# Patient Record
Sex: Male | Born: 1970 | Race: White | Hispanic: No | Marital: Single | Smoking: Current every day smoker
Health system: Southern US, Community
[De-identification: ages and names within clinical notes are randomized; demographics above are authoritative.]

## PROBLEM LIST (undated history)

## (undated) DIAGNOSIS — F1910 Other psychoactive substance abuse, uncomplicated: Secondary | ICD-10-CM

## (undated) DIAGNOSIS — Z720 Tobacco use: Secondary | ICD-10-CM

## (undated) DIAGNOSIS — I10 Essential (primary) hypertension: Secondary | ICD-10-CM

## (undated) DIAGNOSIS — F1190 Opioid use, unspecified, uncomplicated: Secondary | ICD-10-CM

## (undated) HISTORY — DX: Tobacco use: Z72.0

## (undated) HISTORY — PX: HERNIA REPAIR: SHX51

## (undated) HISTORY — DX: Opioid use, unspecified, uncomplicated: F11.90

---

## 2000-10-20 ENCOUNTER — Ambulatory Visit (HOSPITAL_BASED_OUTPATIENT_CLINIC_OR_DEPARTMENT_OTHER): Admission: RE | Admit: 2000-10-20 | Discharge: 2000-10-20 | Payer: Self-pay | Admitting: General Surgery

## 2002-09-12 ENCOUNTER — Encounter: Payer: Self-pay | Admitting: Emergency Medicine

## 2002-09-12 ENCOUNTER — Emergency Department (HOSPITAL_COMMUNITY): Admission: EM | Admit: 2002-09-12 | Discharge: 2002-09-12 | Payer: Self-pay | Admitting: Emergency Medicine

## 2004-01-07 ENCOUNTER — Emergency Department (HOSPITAL_COMMUNITY): Admission: EM | Admit: 2004-01-07 | Discharge: 2004-01-07 | Payer: Self-pay | Admitting: Emergency Medicine

## 2004-03-09 ENCOUNTER — Emergency Department (HOSPITAL_COMMUNITY): Admission: EM | Admit: 2004-03-09 | Discharge: 2004-03-09 | Payer: Self-pay

## 2004-04-01 ENCOUNTER — Ambulatory Visit: Payer: Self-pay | Admitting: Family Medicine

## 2006-04-30 ENCOUNTER — Emergency Department (HOSPITAL_COMMUNITY): Admission: EM | Admit: 2006-04-30 | Discharge: 2006-04-30 | Payer: Self-pay | Admitting: Emergency Medicine

## 2006-05-03 ENCOUNTER — Emergency Department (HOSPITAL_COMMUNITY): Admission: EM | Admit: 2006-05-03 | Discharge: 2006-05-03 | Payer: Self-pay | Admitting: Emergency Medicine

## 2007-03-16 ENCOUNTER — Emergency Department (HOSPITAL_COMMUNITY): Admission: EM | Admit: 2007-03-16 | Discharge: 2007-03-16 | Payer: Self-pay | Admitting: Emergency Medicine

## 2010-09-18 NOTE — Op Note (Signed)
John Cabrera. [facility]  Patient:    DELFIN, Cabrera                      MRN: [id number] Proc. Date: 10/20/00 Adm. Date:  [id number] Attending:  Brandy Hale CC:         Reuben Likes, M.D.   Operative Report  PREOPERATIVE DIAGNOSIS:  Left inguinal hernia.  POSTOPERATIVE DIAGNOSIS:  Left inguinal hernia.  OPERATION PERFORMED:  Repair of left inguinal hernia with mesh.  SURGEON:  Angelia Mould. Derrell Lolling, M.D.  ANESTHESIA:  INDICATIONS FOR PROCEDURE:  The patient is a 40 year old white male who has had a painful bulge in his left groin for about two weeks.  This began to bother him after he was tilling his garden and he has had a painful bulge since that time.  He denies abdominal pain, nausea or vomiting.  On exam he has a large left inguinal hernia which is an indirect hernia extending all the way down through the external ring.  This can be reduced but it requires some difficulty.  No evidence of hernia on the right. He is brought to the operating room electively.  OPERATIVE FINDINGS:   The patient had a large indirect left inguinal hernia containing a lot of omentum which was partially incarcerated and had to be physically reduced.  The floor of the inguinal canal was weak but was not bulging.  DESCRIPTION OF PROCEDURE:  Following the induction of monitored sedation anesthesia, the patients left groin was prepped and draped in a sterile fashion.  Throughout the case, the patient was monitored and sedated by the anesthesia department.  1% Xylocaine with epinephrine was used a local infiltration anesthetic and a left ilioinguinal nerve block.  A transverse incision was made overlying the left inguinal canal.  Dissection was carried down through the subcutaneous tissues exposing the aponeurosis of the external oblique.  The external oblique was incised in the direction of its fibers, opening up the external inguinal ring.  The external oblique  was retracted. The cord structures were mobilized and encircled with a Penrose drain. The ilioinguinal nerve was identified and preserved.  Cremasteric muscle fibers were dissected away from the cord structures.  The vas deferens and testicular vessels were preserved.  A very large indirect hernia sac was dissected away from the surrounding structures all the way back to the internal ring.  The indirect sac was opened and a large amount of omentum was present which was then gently reduced.  Once this was reduced, the hernia sac was seen to be a simple sac.  Palpation within the abdomen was unremarkable.  The indirect sac was twisted under direct vision and then suture ligated at the level of the internal ring with a suture ligature of 2-0 silk.  The redundant sac was excised and discarded.  The floor of the inguinal canal was repaired and reinforced with an onlay graft of polypropylene mesh.  The mesh was cut into an appropriate shape and sutured in place with running sutures of 2-0 Prolene. The mesh was sutured so as to generously overlap the fascia at the pubic tubercle, along the inguinal ligament inferiorly, then along the internal oblique and conjoined tendon superiorly.  The mesh was incised laterally so as to wrap around the cord structures at the internal ring.  The tails of the mesh were overlapped laterally and the suture lines completed.   This provided a very secure repair both medial and lateral  to the internal ring but allowed an adequate opening for the cord structures.  The wound was irrigated with saline.  Hemostasis was excellent.  The cord structures and the ilioinguinal nerve were placed superficial to the mesh.  The external oblique was closed with a running suture of 2-0 Vicryl.  Scarpas fascia was closed with 3-0 Vicryl sutures and the skin closed with running subcuticular suture of 4-0 Vicryl and Steri-Strips.  Clean bandages were placed.  The patient was transferred  to the recovery room in stable condition.  Estimated blood loss was about 10 cc. Complications were none.  Sponge, needle and instrument counts were correct. DD:  10/20/00 TD:  10/20/00 Job: [id number]

## 2013-01-03 ENCOUNTER — Emergency Department (HOSPITAL_COMMUNITY)
Admission: EM | Admit: 2013-01-03 | Discharge: 2013-01-03 | Disposition: A | Discharge status: Home / Self Care | Payer: Self-pay | Attending: Emergency Medicine | Admitting: Emergency Medicine

## 2013-01-03 ENCOUNTER — Emergency Department (HOSPITAL_COMMUNITY): Payer: Self-pay

## 2013-01-03 ENCOUNTER — Encounter (HOSPITAL_COMMUNITY): Payer: Self-pay

## 2013-01-03 DIAGNOSIS — S300XXA Contusion of lower back and pelvis, initial encounter: Secondary | ICD-10-CM

## 2013-01-03 DIAGNOSIS — S20219A Contusion of unspecified front wall of thorax, initial encounter: Secondary | ICD-10-CM | POA: Insufficient documentation

## 2013-01-03 DIAGNOSIS — W1789XA Other fall from one level to another, initial encounter: Secondary | ICD-10-CM | POA: Insufficient documentation

## 2013-01-03 DIAGNOSIS — Y9389 Activity, other specified: Secondary | ICD-10-CM | POA: Insufficient documentation

## 2013-01-03 DIAGNOSIS — S20229A Contusion of unspecified back wall of thorax, initial encounter: Secondary | ICD-10-CM | POA: Insufficient documentation

## 2013-01-03 DIAGNOSIS — Z79899 Other long term (current) drug therapy: Secondary | ICD-10-CM | POA: Insufficient documentation

## 2013-01-03 DIAGNOSIS — S20211A Contusion of right front wall of thorax, initial encounter: Secondary | ICD-10-CM

## 2013-01-03 DIAGNOSIS — W1809XA Striking against other object with subsequent fall, initial encounter: Secondary | ICD-10-CM | POA: Insufficient documentation

## 2013-01-03 DIAGNOSIS — Y929 Unspecified place or not applicable: Secondary | ICD-10-CM | POA: Insufficient documentation

## 2013-01-03 MED ORDER — IBUPROFEN 800 MG PO TABS: 800.0000 mg | ORAL_TABLET | Freq: Three times a day (TID) | ORAL | Status: DC | PRN

## 2013-01-03 MED ORDER — OXYCODONE-ACETAMINOPHEN 5-325 MG PO TABS
1.0000 | ORAL_TABLET | Freq: Once | ORAL | Status: AC
  Administered 2013-01-03: 1 via ORAL
  Filled 2013-01-03: qty 1

## 2013-01-03 MED ORDER — HYDROCODONE-ACETAMINOPHEN 5-325 MG PO TABS: 1.0000 | ORAL_TABLET | Freq: Four times a day (QID) | ORAL | Status: DC | PRN

## 2013-01-03 MED ORDER — KETOROLAC TROMETHAMINE 60 MG/2ML IM SOLN
60.0000 mg | Freq: Once | INTRAMUSCULAR | Status: AC
  Administered 2013-01-03: 60 mg via INTRAMUSCULAR
  Filled 2013-01-03: qty 2

## 2013-01-03 NOTE — ED Notes (Signed)
Patient fell from 69ft ladder onto back. Here with complaint of back pain post incident. Was not seen in hospital or by doctor after injury.

## 2013-01-03 NOTE — ED Provider Notes (Signed)
CSN: [id number]     Arrival date & time 01/03/13  0556 History   First MD Initiated Contact with Patient 01/03/13 [phone]     Chief Complaint  Patient presents with  . Back Pain  . Fall   (Consider location/radiation/quality/duration/timing/severity/associated sxs/prior Treatment) HPI  Patient presents to the emergency department following a fall that occurred on Monday.  The patient, states, that he was on a ladder 6 feet of the ground when he fell, striking metal that was laying on the ground.  Patient, states he has pain in his posterior ribs and lower back, right.  Patient, states he has bruising noted to the area.  Patient, states, that movement and palpation make the pain, worse.  Patient, states, that he did not have any chest pain, shortness of breath, nausea, vomiting, abdominal pain, headache, blurred vision, weakness, numbness, dizziness, or syncope. No past medical history on file. No past surgical history on file. No family history on file. History  Substance Use Topics  . Smoking status: Not on file  . Smokeless tobacco: Not on file  . Alcohol Use: Not on file    Review of Systems All other systems negative except as documented in the HPI. All pertinent positives and negatives as reviewed in the HPI. Allergies  Review of patient's allergies indicates no known allergies.  Home Medications   Current Outpatient Rx  Name  Route  Sig  Dispense  Refill  . bismuth subsalicylate (PEPTO BISMOL) 262 MG/15ML suspension   Oral   Take 15 mLs by mouth every 6 (six) hours as needed for indigestion.         . citalopram (CELEXA) 20 MG tablet   Oral   Take 20 mg by mouth daily.         Marland Kitchen esomeprazole (NEXIUM) 20 MG capsule   Oral   Take 20 mg by mouth daily before breakfast.          BP 122/74  Pulse 64  Temp(Src) 98.1 F (36.7 C) (Oral)  Resp 20  SpO2 100% Physical Exam  Nursing note and vitals reviewed. Constitutional: He is oriented to person, place, and time. He  appears well-developed and well-nourished.  HENT:  Head: Normocephalic and atraumatic.  Eyes: Pupils are equal, round, and reactive to light.  Cardiovascular: Normal rate, regular rhythm and normal heart sounds.   Pulmonary/Chest: Effort normal and breath sounds normal. No respiratory distress.  Musculoskeletal:       Thoracic back: He exhibits tenderness and pain. He exhibits no bony tenderness and no deformity.       Lumbar back: He exhibits tenderness and pain. He exhibits no bony tenderness and no deformity.       Back:  Neurological: He is alert and oriented to person, place, and time. He has normal strength. No sensory deficit. GCS eye subscore is 4. GCS verbal subscore is 5. GCS motor subscore is 6.    ED Course  Procedures (including critical care time) Labs Review Labs Reviewed - No data to display Imaging Review Dg Ribs Unilateral W/chest Right  01/03/2013   *RADIOLOGY REPORT*  Clinical Data: Back pain after fall.  RIGHT RIBS AND CHEST - 3+ VIEW  Comparison: 04/30/2006.  Findings: No significant osseous abnormality.  Lungs are clear. No effusion or pneumothorax.  Cardiomediastinal size and contour are within normal limits.  The upper abdomen is unremarkable.  IMPRESSION:  1. No evidence of acute cardiopulmonary disease. 2.  Negative for fracture.   Original Report Authenticated By:  Tiburcio Pea   Dg Lumbar Spine Complete  01/03/2013   *RADIOLOGY REPORT*  Clinical Data: Fall with pain.  LUMBAR SPINE - COMPLETE 4+ VIEW  Comparison: None.  Findings: Negative for acute fracture or subluxation.  L5-S1 degenerative disc narrowing.  No spondylolysis. Incidental high density material within the descending colon.  IMPRESSION: No evidence of acute lumbar spine injury.   Original Report Authenticated By: Tiburcio Pea   patient has negative x-rays of his lumbar spine and ribs on the right.  Patient be treated with pain medication and told to ice the area.  Told to return here as  needed.  MDM     Carlyle Dolly, PA-C 01/03/13 [phone]

## 2013-01-03 NOTE — ED Provider Notes (Signed)
Medical screening examination/treatment/procedure(s) were performed by non-physician practitioner and as supervising physician I was immediately available for consultation/collaboration.  Reah Justo R. Michiel Sivley, MD 01/03/13 1449 

## 2013-01-03 NOTE — ED Notes (Signed)
Pt dc'd home w/all belongings, 2 new RX, pt verbalizes understanding of dc instructions, pt driven home by friend at bedside

## 2014-06-14 ENCOUNTER — Emergency Department (HOSPITAL_COMMUNITY): Payer: Self-pay

## 2014-06-14 ENCOUNTER — Encounter (HOSPITAL_COMMUNITY): Payer: Self-pay | Admitting: *Deleted

## 2014-06-14 ENCOUNTER — Emergency Department (HOSPITAL_COMMUNITY)
Admission: EM | Admit: 2014-06-14 | Discharge: 2014-06-14 | Disposition: A | Discharge status: Home / Self Care | Payer: Self-pay | Attending: Emergency Medicine | Admitting: Emergency Medicine

## 2014-06-14 DIAGNOSIS — J40 Bronchitis, not specified as acute or chronic: Secondary | ICD-10-CM | POA: Insufficient documentation

## 2014-06-14 DIAGNOSIS — Z79899 Other long term (current) drug therapy: Secondary | ICD-10-CM | POA: Insufficient documentation

## 2014-06-14 DIAGNOSIS — R0789 Other chest pain: Secondary | ICD-10-CM

## 2014-06-14 DIAGNOSIS — Z720 Tobacco use: Secondary | ICD-10-CM | POA: Insufficient documentation

## 2014-06-14 MED ORDER — METHOCARBAMOL 500 MG PO TABS: 500.0000 mg | ORAL_TABLET | Freq: Two times a day (BID) | ORAL | Status: DC

## 2014-06-14 MED ORDER — AEROCHAMBER PLUS W/MASK MISC
1.0000 | Freq: Once | Status: AC
  Administered 2014-06-14: 1
  Filled 2014-06-14: qty 1

## 2014-06-14 MED ORDER — PREDNISONE 10 MG PO TABS: 40.0000 mg | ORAL_TABLET | Freq: Every day | ORAL | Status: DC

## 2014-06-14 MED ORDER — IBUPROFEN 400 MG PO TABS
800.0000 mg | ORAL_TABLET | Freq: Once | ORAL | Status: AC
  Administered 2014-06-14: 800 mg via ORAL
  Filled 2014-06-14: qty 2

## 2014-06-14 MED ORDER — BENZONATATE 100 MG PO CAPS: 100.0000 mg | ORAL_CAPSULE | Freq: Three times a day (TID) | ORAL | Status: DC

## 2014-06-14 MED ORDER — PREDNISONE 20 MG PO TABS
60.0000 mg | ORAL_TABLET | Freq: Once | ORAL | Status: AC
  Administered 2014-06-14: 60 mg via ORAL
  Filled 2014-06-14: qty 3

## 2014-06-14 MED ORDER — ALBUTEROL SULFATE (2.5 MG/3ML) 0.083% IN NEBU
5.0000 mg | INHALATION_SOLUTION | Freq: Once | RESPIRATORY_TRACT | Status: AC
  Administered 2014-06-14: 5 mg via RESPIRATORY_TRACT
  Filled 2014-06-14: qty 6

## 2014-06-14 MED ORDER — METHOCARBAMOL 500 MG PO TABS
500.0000 mg | ORAL_TABLET | Freq: Once | ORAL | Status: AC
  Administered 2014-06-14: 500 mg via ORAL
  Filled 2014-06-14: qty 1

## 2014-06-14 MED ORDER — ALBUTEROL SULFATE HFA 108 (90 BASE) MCG/ACT IN AERS
2.0000 | INHALATION_SPRAY | RESPIRATORY_TRACT | Status: DC | PRN
  Administered 2014-06-14: 2 via RESPIRATORY_TRACT
  Filled 2014-06-14: qty 6.7

## 2014-06-14 NOTE — Discharge Instructions (Signed)
1. Medications: albuterol, prednisone, robaxin, mucinex, tessalon, usual home medications 2. Treatment: rest, drink plenty of fluids, take tylenol or ibuprofen for fever control 3. Follow Up: Please followup with your primary doctor in 3 days for discussion of your diagnoses and further evaluation after today's visit; if you do not have a primary care doctor use the resource guide provided to find one; Return to the ER for high fevers, difficulty breathing or other concerning symptoms     Chest Wall Pain Chest wall pain is pain in or around the bones and muscles of your chest. It may take up to 6 weeks to get better. It may take longer if you must stay physically active in your work and activities.  CAUSES  Chest wall pain may happen on its own. However, it may be caused by:  A viral illness like the flu.  Injury.  Coughing.  Exercise.  Arthritis.  Fibromyalgia.  Shingles. HOME CARE INSTRUCTIONS   Avoid overtiring physical activity. Try not to strain or perform activities that cause pain. This includes any activities using your chest or your abdominal and side muscles, especially if heavy weights are used.  Put ice on the sore area.  Put ice in a plastic bag.  Place a towel between your skin and the bag.  Leave the ice on for 15-20 minutes per hour while awake for the first 2 days.  Only take over-the-counter or prescription medicines for pain, discomfort, or fever as directed by your caregiver. SEEK IMMEDIATE MEDICAL CARE IF:   Your pain increases, or you are very uncomfortable.  You have a fever.  Your chest pain becomes worse.  You have new, unexplained symptoms.  You have nausea or vomiting.  You feel sweaty or lightheaded.  You have a cough with phlegm (sputum), or you cough up blood. MAKE SURE YOU:   Understand these instructions.  Will watch your condition.  Will get help right away if you are not doing well or get worse. Document Released:  04/19/2005 Document Revised: 07/12/2011 Document Reviewed: 12/14/2010 [facility] Patient Information 2015 [place], [place]. This information is not intended to replace advice given to you by your health care provider. Make sure you discuss any questions you have with your health care provider.    Emergency Department Resource Guide 1) Find a Doctor and Pay Out of Pocket Although you won't have to find out who is covered by your insurance plan, it is a good idea to ask around and get recommendations. You will then need to call the office and see if the doctor you have chosen will accept you as a new patient and what types of options they offer for patients who are self-pay. Some doctors offer discounts or will set up payment plans for their patients who do not have insurance, but you will need to ask so you aren't surprised when you get to your appointment.  2) Contact Your Local Health Department Not all health departments have doctors that can see patients for sick visits, but many do, so it is worth a call to see if yours does. If you don't know where your local health department is, you can check in your phone book. The CDC also has a tool to help you locate your state's health department, and many state websites also have listings of all of their local health departments.  3) Find a Walk-in Clinic If your illness is not likely to be very severe or complicated, you may want to try a walk  in clinic. These are popping up all over the country in pharmacies, drugstores, and shopping centers. They're usually staffed by nurse practitioners or physician assistants that have been trained to treat common illnesses and complaints. They're usually fairly quick and inexpensive. However, if you have serious medical issues or chronic medical problems, these are probably not your best option.  No Primary Care Doctor: - Call Health Connect at  [phone] - they can help you locate a primary care doctor that  accepts  your insurance, provides certain services, etc. - Physician Referral Service- [phone]  Chronic Pain Problems: Organization         Address  Phone   Notes  [facility]  [phone] Patients need to be referred by their primary care doctor.   Medication Assistance: Organization         Address  Phone   Notes  [facility] Medication [facility] [address] [place], [place] [address] [phone] --Must be a resident of [facility] -- Must have NO insurance coverage whatsoever (no Medicaid/ Medicare, etc.) -- The pt. MUST have a primary care doctor that directs their care regularly and follows them in the community   [facility]ist  [phone]   [employer]  [phone]    Agencies that provide inexpensive medical care: Organization         Address  Phone   Notes  [facility]  [phone]   [facility]    [phone]   [facility] [address] [place], [place] [address] [phone]   [facility] [address], [place] [phone]   Planned Parenthood    [phone]   [facility]    [phone]   [facility]  [address], [place] Phone:  [phone], Fax:  [phone] Hours of Operation:  9 am - 6 pm, M-F.  Also accepts Medicaid/Medicare and self-pay.  [facility]  [address], [place] Phone: [phone], Fax: [phone]. Hours of Operation:  8:30 am - 5:30 pm, M-F.  Also accepts Medicaid and self-pay.  [facility] [place] [address], [place] Phone: [phone]   [facility] [address] Natasha Bence [place], [place] [phone], Ext. [phone] Mondays & Thursdays: 7-9 AM.  First 15 patients are seen on a first come, first serve basis.    Medicaid-accepting [facility] Providers:  Organization          Address  Phone   Notes  [facility] [address], [place] [phone] Also accepts self-pay patients.  [facility] [address] Laurell Josephs [place], [place]  [phone]   [facility] [address], [place] [phone]   [facility] [address] [place] [phone]   Renaye Rakers [address], [place]   [phone] Only accepts [place] patients after they have their name applied to their card.   Self-Pay (no insurance) in [facility]:  Organization         Address  Phone   Notes  Sickle Cell Patients, [facility] [address] [place] [address] [place], [place] [phone]   [facility] [address] [place], [place] [phone]   [facility] [place]  [address], [place] [phone]   [facility]/Dr. Osei-Bonsu  [address] [place] or [address], [place] [phone] Phone number for both [place] and [place] locations is the same.  [facility] [address] [place] [phone]   [facility] [address] [place] or [address] [phone]   [facility] [address], [place] [phone], phone; [phone], fax Sees patients 1st and 3rd Saturday of every month.  Must not qualify for public or private insurance (i.e. Medicaid, Medicare, [place] Health Choice, Veterans' Benefits)  Household income should be no more than 200% of the poverty level The clinic cannot treat you if you are pregnant or think you are pregnant  Sexually transmitted diseases are not treated at the clinic.    Dental Care: Organization         Address  Phone  Notes  [facility] Department of [facility] [address] [place],  [place] [phone] Accepts children up to age 90 who are enrolled in [place] or [place] Health Choice; pregnant women with a Medicaid card; and children who have applied for Medicaid or [place] Health Choice, but were declined, whose parents can pay a reduced fee at time of service.  [facility] Department of [facility]  [address], [place] [phone] Accepts children up to age 16 who are enrolled in [place] or [place] Health Choice; pregnant women with a Medicaid card; and children who have applied for Medicaid or [place] Health Choice, but were declined, whose parents can pay a reduced fee at time of service.  [facility]  [address] [place], [place] [phone] Patients are seen by appointment only. Walk-ins are not accepted. [facility] will see patients 53 years of age and older. Monday - Tuesday (8am-5pm) Most Wednesdays (8:30-5pm) $30 per visit, cash only  [facility]  [address], [facility] [phone] Patients are seen by appointment only. Walk-ins are not accepted. [facility] will see patients 48 years of age and older. One Wednesday Evening (Monthly: Volunteer Based).  $30 per visit, cash only  [employer]  [phone] for adults; Children under age 57, call Graduate Pediatric Dentistry at [phone]. Children aged 44-14, please call [phone] to request a pediatric application.  Dental services are provided in all areas of dental care including fillings, crowns and bridges, complete and partial dentures, implants, gum treatment, root canals, and extractions. Preventive care is also provided. Treatment is provided to both adults and children. Patients are selected via a lottery and there is often a waiting list.   [facility] [address], [place]  [phone] [email or web address]   [facility] [address] [place], [place]  [phone], Ext. [phone] Second and Fourth Thursday of each month, opens at 6:30 AM; Clinic ends at 9 AM.  Patients are seen on a first-come first-served basis, and a limited number are seen during each clinic.   [facility]  [address] Ether Griffins [place], [place] [phone]   Eligibility Requirements You must have lived in [place], [place], or [place] counties for at least the last three months.   You cannot be eligible for state or federal sponsored [place],  including [employer]IG[employer]eterans Administration, [place]icaid, or [employer]icare.   You generally cannot be eligible for healthcare insurance through your employer.    How to apply: Eligibility screenings are held every Tuesday and Wednesday afternoon from 1:00 pm until 4:00 pm. You do not need an appointment for the interview!  [facility] [address], [place], [place] [phone]   [facility]  [phone]   [facility]  [phone]   [facility]  [phone]    Behavioral Health Resources in the Community: Intensive Outpatient Programs Organization         Address  Phone  Notes  [facility] [address], [place], [place] [phone]   [facility] [address], [place], [place] [phone]   [facility] [address], [place], [place]  [phone]   [facility] [address],  [place], [place] [phone] or [phone]   Substance Abuse Resources Organization         Address  Phone  Notes  [facility]  [phone]   [facility]  [phone]   [facility]  [phone]   [facility]  [phone]   Residential & Outpatient Substance Abuse Program  [phone]   Psychological Services Organization         Address  Phone  Notes  [facility]  [phone]- [phone]   [facility]  [phone]- [phone]    [facility] [address], [place] [phone] or [phone]    Mobile Crisis Teams Organization         Address  Phone  Notes  [facility], Mobile Crisis Care Unit  [phone]   [facility]  [address] [place], [place] [phone]   Doristine LocksSharon DeEsch [address] [place] [phone]    Self-Help/Support Groups Organization         Address  Phone             Notes  [facility] [place] - variety of support groups  [phone] Call for more information  Narcotics Anonymous (NA), [facility] [address], [place]  2 meetings at this location   Statisticianesidential Treatment Programs Organization         Address  Phone  Notes  [facility] [address],    [place]  [phone]   [facility]  [address], [place] [phone], [place], [place] [phone]   [facility] [address], [place] [phone] Admissions: 8am-3pm M-F  [facility] [address]    [place], [place] [phone]   [facility] [address], [place], [place] [phone]   [facility] [address]  [place], [place] [phone]   [facility] - Intensive Outpatient [address], [place], [place] [phone]   [facility] ([facility] [address]  [place], [place] [phone] or [phone]   [facility] ([facility]) [address] [place], [place] [phone] Accepts Medicaid  [facility] [address]  [place] [phone] Substance Abuse/Addiction Treatment   [facility] Resources Organization         Address  Phone  Notes  [facility]  [phone]) [phone]   Angie FavaJulie Brannon, PhD [address] [place], [place]   [phone] or [phone]  [facility]   [address] [place], [place] [phone]   [address], [place], [place] [phone] Insurance/Medicaid/sponsorship through [facility] and Families [address], Ste [place], [place] [phone] [place], [place] [phone]    Dr. Adele Schilder  [phone]   [facility] Dept. 1) [address], [place] 2) [place] 3)  [address], [place] [phone]  [phone]   [place] [phone] or [phone] (After Hours)

## 2014-06-14 NOTE — ED Notes (Signed)
Pt receiving breathing tx at this time 

## 2014-06-14 NOTE — ED Provider Notes (Signed)
CSN: [id number]     Arrival date & time 06/14/14  1632 History  This chart was scribed for non-physician practitioner, Dierdre ForthHannah Cheris Tweten, PA-C, working with Rolland PorterMark James, MD, by Bronson CurbJacqueline Melvin, ED Scribe. This patient was seen in room TR07C/TR07C and the patient's care was started at 6:19 PM.   Chief Complaint  Patient presents with  . Cough    The history is provided by the patient and medical records. No language interpreter was used.     HPI Comments: John Cabrera is a 44 y.o. male, with no significant medical history, who presents to the Emergency Department complaining of persistent, worsening chest congestion for the past month. There is associated 10/10, burning sore throat, right ear pain, subjective fever (triage temp 98.5 F), and chills. Patient also notes constant chest tightness for the past month that is worse with cough. He states the burning pain is worse with deep breathing. Patient tries to refrain from coughing due to the pain in his throat and anterior chest. He has been taking Mucinex without significant improvement. He denies history of prior similar episodes. Patient suspects he may have bronchitis. Patient states that he was not sick with a cold or flu prior to onset of symptoms and denies any sick contacts. He further denies rhinorrhea or SOB. Patient is a current ppd smoker for the past 15 years. Patient does not take any medication and has NKDA.   History reviewed. No pertinent past medical history. History reviewed. No pertinent past surgical history. No family history on file. History  Substance Use Topics  . Smoking status: Current Every Day Smoker  . Smokeless tobacco: Not on file  . Alcohol Use: Yes    Review of Systems  Constitutional: Positive for fever (subjective) and chills. Negative for diaphoresis, appetite change, fatigue and unexpected weight change.  HENT: Positive for congestion, ear pain and sore throat. Negative for mouth sores and rhinorrhea.    Eyes: Negative for visual disturbance.  Respiratory: Positive for cough and chest tightness ( burning feeling). Negative for shortness of breath and wheezing.   Cardiovascular: Negative for chest pain.  Gastrointestinal: Negative for nausea, vomiting, abdominal pain, diarrhea and constipation.  Endocrine: Negative for polydipsia, polyphagia and polyuria.  Genitourinary: Negative for dysuria, urgency, frequency and hematuria.  Musculoskeletal: Negative for back pain and neck stiffness.  Skin: Negative for rash.  Allergic/Immunologic: Negative for immunocompromised state.  Neurological: Negative for syncope, light-headedness and headaches.  Hematological: Does not bruise/bleed easily.  Psychiatric/Behavioral: Negative for sleep disturbance. The patient is not nervous/anxious.       Allergies  Review of patient's allergies indicates no known allergies.  Home Medications   Prior to Admission medications   Medication Sig Start Date End Date Taking? Authorizing Provider  dextromethorphan 15 MG/5ML syrup Take 10 mLs by mouth 4 (four) times daily as needed for cough.   Yes Historical Provider, MD  dextromethorphan-guaiFENesin (MUCINEX DM) 30-600 MG per 12 hr tablet Take 1 tablet by mouth 2 (two) times daily as needed for cough.   Yes Historical Provider, MD  ibuprofen (ADVIL,MOTRIN) 800 MG tablet Take 1 tablet (800 mg total) by mouth every 8 (eight) hours as needed for pain. 01/03/13  Yes Jamesetta Orleanshristopher W Lawyer, PA-C  benzonatate (TESSALON) 100 MG capsule Take 1 capsule (100 mg total) by mouth every 8 (eight) hours. 06/14/14   Sholonda Jobst, PA-C  HYDROcodone-acetaminophen (NORCO/VICODIN) 5-325 MG per tablet Take 1 tablet by mouth every 6 (six) hours as needed for pain. Patient not taking: Reported  on 06/14/2014 01/03/13   Jamesetta Orleans Lawyer, PA-C  methocarbamol (ROBAXIN) 500 MG tablet Take 1 tablet (500 mg total) by mouth 2 (two) times daily. 06/14/14   Corah Willeford, PA-C  predniSONE  (DELTASONE) 10 MG tablet Take 4 tablets (40 mg total) by mouth daily. 06/14/14   Hafsa Lohn, PA-C   Triage Vitals: BP 132/84 mmHg  Pulse 87  Temp(Src) 98.5 F (36.9 C) (Oral)  Resp 16  SpO2 99%  Physical Exam  Constitutional: He is oriented to person, place, and time. He appears well-developed and well-nourished. No distress.  HENT:  Head: Normocephalic and atraumatic.  Right Ear: Tympanic membrane, external ear and ear canal normal.  Left Ear: Tympanic membrane, external ear and ear canal normal.  Nose: Mucosal edema and rhinorrhea present. No epistaxis. Right sinus exhibits no maxillary sinus tenderness and no frontal sinus tenderness. Left sinus exhibits no maxillary sinus tenderness and no frontal sinus tenderness.  Mouth/Throat: Uvula is midline and mucous membranes are normal. Mucous membranes are not pale and not cyanotic. No oropharyngeal exudate, posterior oropharyngeal edema, posterior oropharyngeal erythema or tonsillar abscesses.  Eyes: Conjunctivae are normal. Pupils are equal, round, and reactive to light.  Neck: Normal range of motion and full passive range of motion without pain.  Cardiovascular: Normal rate, normal heart sounds and intact distal pulses.  Exam reveals no gallop and no friction rub.   No murmur heard. Pulmonary/Chest: Effort normal. No stridor. He has decreased breath sounds. He has no wheezes. He has no rhonchi. He has no rales. He exhibits tenderness.  Diminished but clear and equal breath sounds without focal wheezes, rhonchi, rales TTP of the anterior chest and bilateral ribs  Abdominal: Soft. Bowel sounds are normal. He exhibits no distension. There is no tenderness.  Musculoskeletal: Normal range of motion.  Lymphadenopathy:    He has no cervical adenopathy.  Neurological: He is alert and oriented to person, place, and time.  Skin: Skin is warm and dry. No rash noted. He is not diaphoretic. No erythema.  Psychiatric: He has a normal mood and  affect.  Nursing note and vitals reviewed.   ED Course  Procedures (including critical care time)  DIAGNOSTIC STUDIES: Oxygen Saturation is 99% on room air, normal by my interpretation.    COORDINATION OF CARE: At 1827 Discussed treatment plan with patient which includes breathing treatment and muscle relaxer. Patient agrees.   Labs Review Labs Reviewed - No data to display  Imaging Review Dg Chest 2 View  06/14/2014   CLINICAL DATA:  44 year old male with chest pain for 1 month, lucent below is, cough and sore throat. Congestion. Initial encounter.  EXAM: CHEST  2 VIEW  COMPARISON:  04/30/2006.  FINDINGS: Normal cardiac size and mediastinal contours. Visualized tracheal air column is within normal limits. Lung volumes remain normal. No pneumothorax, pulmonary edema, pleural effusion or confluent pulmonary opacity. No acute osseous abnormality identified.  IMPRESSION: No acute cardiopulmonary abnormality.   Electronically Signed   By: Odessa Fleming M.D.   On: 06/14/2014 18:53     EKG Interpretation   Date/Time:  Friday June 14 2014 16:37:58 EST Ventricular Rate:  83 PR Interval:  162 QRS Duration: 78 QT Interval:  380 QTC Calculation: 446 R Axis:   74 Text Interpretation:  Normal sinus rhythm Normal ECG Confirmed by Fayrene Fearing   MD, MARK ([id number]) on 06/14/2014 6:02:46 PM      MDM   Final diagnoses:  Bronchitis  Chest wall pain   Wren Gallaga presents with  burning chest discomfort, cough and ST x 1 month.  Pt reports no relief with OTC treatments.  Diminished breath sounds without focal wheezes rhonchi or rales.  Pt with normal pluse Ox and no tachycardia.  PERC negative and no risk factors for DVT (no hx of DVT, surgery, travel, broken bones, estrogen or leg swelling).  Doubt PE.    ECG ordered in triage without ischemia.  Chest wall pain is reproducible with coughing and palpation.  No concern for ACS.  Will give albuterol, ibuprofen, robaxin, prednisone and reassess,    7:24  PM Pt with improved breath sounds after albuterol treatment.  Pt CXR negative for acute infiltrate. Patients symptoms are consistent with URI, likely viral etiology. Discussed that antibiotics are not indicated for viral infections. Pt will be discharged with symptomatic treatment.    I have personally reviewed patient's vitals, nursing note and any pertinent labs or imaging.  I performed an focused physical exam; undressed when appropriate .    It has been determined that no acute conditions requiring further emergency intervention are present at this time. The patient/guardian have been advised of the diagnosis and plan. I reviewed any labs and imaging including any potential incidental findings. We have discussed signs and symptoms that warrant return to the ED and they are listed in the discharge instructions.    Vital signs are stable at discharge.   BP 132/84 mmHg  Pulse 87  Temp(Src) 98.5 F (36.9 C) (Oral)  Resp 16  SpO2 99%  I personally performed the services described in this documentation, which was scribed in my presence. The recorded information has been reviewed and is accurate.   Dahlia Client Arrow Emmerich, PA-C 06/14/14 1924  Rolland Porter, MD 06/19/14 [phone]

## 2014-06-14 NOTE — ED Notes (Signed)
The pt is c/o  A sorethroat hurting all over his body non-productive cough for one month.  Intermittent temp.

## 2014-06-14 NOTE — ED Notes (Signed)
Pt st's he has had a non-productive cough x's 1 month.  Pt st's his throat hurts and now feels like he can't cough because it hurts his throat.

## 2014-06-17 ENCOUNTER — Encounter (HOSPITAL_COMMUNITY): Payer: Self-pay | Admitting: *Deleted

## 2014-06-17 ENCOUNTER — Emergency Department (HOSPITAL_COMMUNITY)
Admission: EM | Admit: 2014-06-17 | Discharge: 2014-06-17 | Disposition: A | Discharge status: Home / Self Care | Payer: Self-pay | Attending: Emergency Medicine | Admitting: Emergency Medicine

## 2014-06-17 ENCOUNTER — Emergency Department (HOSPITAL_COMMUNITY): Payer: Self-pay

## 2014-06-17 DIAGNOSIS — Z791 Long term (current) use of non-steroidal anti-inflammatories (NSAID): Secondary | ICD-10-CM | POA: Insufficient documentation

## 2014-06-17 DIAGNOSIS — Z720 Tobacco use: Secondary | ICD-10-CM | POA: Insufficient documentation

## 2014-06-17 DIAGNOSIS — Y998 Other external cause status: Secondary | ICD-10-CM | POA: Insufficient documentation

## 2014-06-17 DIAGNOSIS — Y9389 Activity, other specified: Secondary | ICD-10-CM | POA: Insufficient documentation

## 2014-06-17 DIAGNOSIS — Y9289 Other specified places as the place of occurrence of the external cause: Secondary | ICD-10-CM | POA: Insufficient documentation

## 2014-06-17 DIAGNOSIS — W11XXXA Fall on and from ladder, initial encounter: Secondary | ICD-10-CM | POA: Insufficient documentation

## 2014-06-17 DIAGNOSIS — Z7952 Long term (current) use of systemic steroids: Secondary | ICD-10-CM | POA: Insufficient documentation

## 2014-06-17 DIAGNOSIS — Z79899 Other long term (current) drug therapy: Secondary | ICD-10-CM | POA: Insufficient documentation

## 2014-06-17 DIAGNOSIS — M25532 Pain in left wrist: Secondary | ICD-10-CM

## 2014-06-17 DIAGNOSIS — S6992XA Unspecified injury of left wrist, hand and finger(s), initial encounter: Secondary | ICD-10-CM | POA: Insufficient documentation

## 2014-06-17 MED ORDER — IBUPROFEN 800 MG PO TABS: 800.0000 mg | ORAL_TABLET | Freq: Three times a day (TID) | ORAL | Status: DC | PRN

## 2014-06-17 MED ORDER — OXYCODONE-ACETAMINOPHEN 5-325 MG PO TABS
1.0000 | ORAL_TABLET | Freq: Once | ORAL | Status: AC
  Administered 2014-06-17: 1 via ORAL
  Filled 2014-06-17: qty 1

## 2014-06-17 MED ORDER — HYDROCODONE-ACETAMINOPHEN 5-325 MG PO TABS: 1.0000 | ORAL_TABLET | ORAL | Status: DC | PRN

## 2014-06-17 NOTE — ED Notes (Signed)
Pt fell from ladder on Sunday approx 7 ft onto LT arm . Bruise is present on Lt elbow. Pt moves all ext. Pt denies hitting his head.

## 2014-06-17 NOTE — ED Notes (Signed)
Declined W/C at D/C and was escorted to lobby by RN. 

## 2014-06-17 NOTE — Discharge Instructions (Signed)
Read the information below.  Use the prescribed medication as directed.  Please discuss all new medications with your pharmacist.  Do not take additional tylenol while taking the prescribed pain medication to avoid overdose.  You may return to the Emergency Department at any time for worsening condition or any new symptoms that concern you.  If you develop uncontrolled pain, weakness or numbness of the extremity, severe discoloration of the skin, or you are unable to move your fingers or wrist, return to the ER for a recheck.       Wrist Pain Wrist injuries are frequent in adults and children. A sprain is an injury to the ligaments that hold your bones together. A strain is an injury to muscle or muscle cord-like structures (tendons) from stretching or pulling. Generally, when wrists are moderately tender to touch following a fall or injury, a break in the bone (fracture) may be present. Most wrist sprains or strains are better in 3 to 5 days, but complete healing may take several weeks. HOME CARE INSTRUCTIONS   Put ice on the injured area.  Put ice in a plastic bag.  Place a towel between your skin and the bag.  Leave the ice on for 15-20 minutes, 3-4 times a day, for the first 2 days, or as directed by your health care provider.  Keep your arm raised above the level of your heart whenever possible to reduce swelling and pain.  Rest the injured area for at least 48 hours or as directed by your health care provider.  If a splint or elastic bandage has been applied, use it for as long as directed by your health care provider or until seen by a health care provider for a follow-up exam.  Only take over-the-counter or prescription medicines for pain, discomfort, or fever as directed by your health care provider.  Keep all follow-up appointments. You may need to follow up with a specialist or have follow-up X-rays. Improvement in pain level is not a guarantee that you did not fracture a bone in  your wrist. The only way to determine whether or not you have a broken bone is by X-ray. SEEK IMMEDIATE MEDICAL CARE IF:   Your fingers are swollen, very red, white, or cold and blue.  Your fingers are numb or tingling.  You have increasing pain.  You have difficulty moving your fingers. MAKE SURE YOU:   Understand these instructions.  Will watch your condition.  Will get help right away if you are not doing well or get worse. Document Released: 01/27/2005 Document Revised: 04/24/2013 Document Reviewed: 06/10/2010 [facility]ExitCare Patient Information 2015 [employer] [place]. This information is not intended to replace advice given to you by your health care provider. Make sure you discuss any questions you have with your health care provider.

## 2014-06-17 NOTE — ED Notes (Signed)
Pt requested a second arm splint . Pt was instructed  Only one splint is given. Pt family member appeared to be upset because a second wrist splint was not given.

## 2014-06-17 NOTE — ED Provider Notes (Signed)
CSN: [id number]     Arrival date & time 06/17/14  4098 History  This chart was scribed for non-physician practitioner, Trixie Dredge, PA-C, working with Elwin Mocha, MD by Charline Bills, ED Scribe. This patient was seen in room TR06C/TR06C and the patient's care was started at 9:08 AM.   Chief Complaint  Patient presents with  . Wrist Pain    LT  . Arm Pain    LT  . Elbow Pain   The history is provided by the patient. No language interpreter was used.   HPI Comments: John Cabrera is a 44 y.o. male who presents to the Emergency Department complaining of constant, gradually worsening L wrist pain that radiates into L elbow onset yesterday. Pt states that he was fixing a leak in the ceiling when he slipped and fell off a ladder yesterday. Pt describes L arm pain as 9/10, constant, throbbing sensation that is exacerbated with movement. He reports associated weakness in his L fingers and joint swelling. Pt denies hitting his head, light-headedness, abdominal pain, chest pain, numbness. He reports previous fracture in the same hand. Pt has been treating with ice and Tylenol without relief.   History reviewed. No pertinent past medical history. Past Surgical History  Procedure Laterality Date  . Hernia repair Left 1916    groin   History reviewed. No pertinent family history. History  Substance Use Topics  . Smoking status: Current Every Day Smoker  . Smokeless tobacco: Not on file  . Alcohol Use: Yes    Review of Systems  Cardiovascular: Negative for chest pain.  Gastrointestinal: Negative for abdominal pain.  Musculoskeletal: Positive for joint swelling and arthralgias.  Skin: Negative for color change.  Allergic/Immunologic: Negative for immunocompromised state.  Neurological: Positive for weakness. Negative for light-headedness and numbness.  Hematological: Does not bruise/bleed easily.  Psychiatric/Behavioral: Negative for self-injury.   Allergies  Asa and Tylenol  Home  Medications   Prior to Admission medications   Medication Sig Start Date End Date Taking? Authorizing Provider  benzonatate (TESSALON) 100 MG capsule Take 1 capsule (100 mg total) by mouth every 8 (eight) hours. 06/14/14   Hannah Muthersbaugh, PA-C  dextromethorphan 15 MG/5ML syrup Take 10 mLs by mouth 4 (four) times daily as needed for cough.    Historical Provider, MD  dextromethorphan-guaiFENesin (MUCINEX DM) 30-600 MG per 12 hr tablet Take 1 tablet by mouth 2 (two) times daily as needed for cough.    Historical Provider, MD  HYDROcodone-acetaminophen (NORCO/VICODIN) 5-325 MG per tablet Take 1 tablet by mouth every 6 (six) hours as needed for pain. Patient not taking: Reported on 06/14/2014 01/03/13   Jamesetta Orleans Lawyer, PA-C  ibuprofen (ADVIL,MOTRIN) 800 MG tablet Take 1 tablet (800 mg total) by mouth every 8 (eight) hours as needed for pain. 01/03/13   Jamesetta Orleans Lawyer, PA-C  methocarbamol (ROBAXIN) 500 MG tablet Take 1 tablet (500 mg total) by mouth 2 (two) times daily. 06/14/14   Hannah Muthersbaugh, PA-C  predniSONE (DELTASONE) 10 MG tablet Take 4 tablets (40 mg total) by mouth daily. 06/14/14   Hannah Muthersbaugh, PA-C   Triage Vitals: BP 143/83 mmHg  Pulse 79  Temp(Src) 97.9 F (36.6 C) (Oral)  Resp 16  Ht 5' 10.5" (1.791 m)  Wt 185 lb (83.915 kg)  BMI 26.16 kg/m2  SpO2 99% Physical Exam  Constitutional: He appears well-developed and well-nourished. No distress.  HENT:  Head: Normocephalic and atraumatic.  Neck: Neck supple.  Pulmonary/Chest: Effort normal.  Musculoskeletal:  Diffuse tenderness  throughout L forearm, wrist and hand. Focal tenderness over distal ulnar and ulnar aspect of wrist into fifth metatarsal. Local edema in this area. Decreased active ROM in L wirst and L fingers secondary to pain. Distal sensation intact. Capillary refill less than 2 seconds.   Neurological: He is alert.  Skin: He is not diaphoretic.  Nursing note and vitals reviewed.  ED Course   Procedures (including critical care time) DIAGNOSTIC STUDIES: Oxygen Saturation is 99% on RA, normal by my interpretation.    COORDINATION OF CARE: 9:12 AM-Discussed treatment plan which includes XR and Percocet with pt at bedside and pt agreed to plan.   Labs Review Labs Reviewed - No data to display  Imaging Review Dg Elbow Complete Left  06/17/2014   CLINICAL DATA:  Patient fell off ladder  EXAM: LEFT ELBOW - COMPLETE 3+ VIEW  COMPARISON:  None.  FINDINGS: Frontal, lateral, and bilateral oblique views were obtained. There is no fracture or dislocation. No effusion. Joint spaces appear intact. There is a small benign exostosis arising from the distal humeral diaphysis.  IMPRESSION: No fracture or dislocation. Small benign exostosis arising from the distal humerus.   Electronically Signed   By: Bretta BangWilliam  Woodruff III M.D.   On: 06/17/2014 09:31   Dg Wrist Complete Left  06/17/2014   CLINICAL DATA:  Patient fell off ladder  EXAM: LEFT WRIST - COMPLETE 3+ VIEW  COMPARISON:  None.  FINDINGS: Frontal, oblique, lateral, and ulnar deviation scaphoid images were obtained. There is no apparent fracture or dislocation. Joint spaces appear intact. No erosive change or intra-articular calcification.  IMPRESSION: No fracture or dislocation.  No appreciable arthropathy.   Electronically Signed   By: Bretta BangWilliam  Woodruff III M.D.   On: 06/17/2014 09:30   Dg Hand Complete Left  06/17/2014   CLINICAL DATA:  Fall from ladder with fifth metacarpal pain. Initial encounter.  EXAM: LEFT HAND - COMPLETE 3+ VIEW  COMPARISON:  05/09/2013  FINDINGS: There is no evidence of fracture or dislocation.  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  IMPRESSION: No acute osseous findings.   Electronically Signed   By: Marnee SpringJonathon  Watts M.D.   On: 06/17/2014 09:39    EKG Interpretation None      MDM   Final diagnoses:  Left wrist pain    Afebrile,  nontoxic patient with injury to his left wrist and arm after fall from ladder (mechanical fall).   Xray negative.   D/C home with velcro wrist splint, RICE instructions, pain medication, PCP follow up.   Discussed result, findings, treatment, and follow up  with patient.  Pt given return precautions.  Pt verbalizes understanding and agrees with plan.     I personally performed the services described in this documentation, which was scribed in my presence. The recorded information has been reviewed and is accurate.   Trixie Dredgemily Ariel Dimitri, PA-C 06/17/14 1134  Elwin MochaBlair Walden, MD 06/17/14 [phone]

## 2014-10-28 ENCOUNTER — Encounter (HOSPITAL_COMMUNITY): Payer: Self-pay | Admitting: Emergency Medicine

## 2014-10-28 ENCOUNTER — Emergency Department (HOSPITAL_COMMUNITY)
Admission: EM | Admit: 2014-10-28 | Discharge: 2014-10-28 | Disposition: A | Discharge status: Home / Self Care | Payer: Self-pay | Attending: Emergency Medicine | Admitting: Emergency Medicine

## 2014-10-28 DIAGNOSIS — Z720 Tobacco use: Secondary | ICD-10-CM | POA: Insufficient documentation

## 2014-10-28 DIAGNOSIS — M545 Low back pain: Secondary | ICD-10-CM

## 2014-10-28 DIAGNOSIS — Z79899 Other long term (current) drug therapy: Secondary | ICD-10-CM | POA: Insufficient documentation

## 2014-10-28 DIAGNOSIS — M5440 Lumbago with sciatica, unspecified side: Secondary | ICD-10-CM | POA: Insufficient documentation

## 2014-10-28 NOTE — Discharge Instructions (Signed)

## 2014-10-28 NOTE — ED Notes (Signed)
Patient states "fell through roof about 3 weeks ago and has had back trouble since".   Patient states has "been taking percocet and xanax that friends gave him".   Patient states did take tylenol and ibuprofen, but didn't help.  Low back pain.

## 2014-10-28 NOTE — ED Provider Notes (Signed)
CSN: [id number]     Arrival date & time 10/28/14  0751 History   First MD Initiated Contact with Patient 10/28/14 0759     Chief Complaint  Patient presents with  . Back Pain     (Consider location/radiation/quality/duration/timing/severity/associated sxs/prior Treatment) HPI Comments: Patient presents to the emergency department with chief complaint of low back pain. Patient states that he fell through a roof about 3 weeks ago. States that he has been having persistent low back pain. He denies any bowel or bladder incontinence. Denies any numbness or weakness of the lower extremities. The pain is aggravated with working. Patient states that he has been taking his friend's Percocet and Xanax. States that his symptoms have been persistent. He denies any fevers chills.  The history is provided by the patient. No language interpreter was used.    History reviewed. No pertinent past medical history. Past Surgical History  Procedure Laterality Date  . Hernia repair Left 1916    groin   No family history on file. History  Substance Use Topics  . Smoking status: Current Every Day Smoker -- 1.00 packs/day    Types: Cigarettes  . Smokeless tobacco: Not on file  . Alcohol Use: Yes    Review of Systems  Constitutional: Negative for fever and chills.  Gastrointestinal:       No bowel incontinence  Genitourinary:       No urinary incontinence  Musculoskeletal: Positive for myalgias, back pain and arthralgias.  Neurological:       No saddle anesthesia      Allergies  Asa  Home Medications   Prior to Admission medications   Medication Sig Start Date End Date Taking? Authorizing Provider  benzonatate (TESSALON) 100 MG capsule Take 1 capsule (100 mg total) by mouth every 8 (eight) hours. 06/14/14   Hannah Muthersbaugh, PA-C  dextromethorphan 15 MG/5ML syrup Take 10 mLs by mouth 4 (four) times daily as needed for cough.    Historical Provider, MD  dextromethorphan-guaiFENesin  (MUCINEX DM) 30-600 MG per 12 hr tablet Take 1 tablet by mouth 2 (two) times daily as needed for cough.    Historical Provider, MD  HYDROcodone-acetaminophen (NORCO/VICODIN) 5-325 MG per tablet Take 1 tablet by mouth every 4 (four) hours as needed for moderate pain or severe pain. 06/17/14   Trixie DredgeEmily West, PA-C  ibuprofen (ADVIL,MOTRIN) 800 MG tablet Take 1 tablet (800 mg total) by mouth every 8 (eight) hours as needed for mild pain or moderate pain. 06/17/14   Trixie DredgeEmily West, PA-C  methocarbamol (ROBAXIN) 500 MG tablet Take 1 tablet (500 mg total) by mouth 2 (two) times daily. 06/14/14   Hannah Muthersbaugh, PA-C  predniSONE (DELTASONE) 10 MG tablet Take 4 tablets (40 mg total) by mouth daily. 06/14/14   Hannah Muthersbaugh, PA-C   BP 128/74 mmHg  Pulse 78  Temp(Src) 98 F (36.7 C) (Oral)  Resp 18  Ht 5\' 11"  (1.803 m)  Wt 215 lb (97.523 kg)  BMI 30.00 kg/m2  SpO2 98% Physical Exam  Constitutional: He is oriented to person, place, and time. He appears well-developed and well-nourished. No distress.  HENT:  Head: Normocephalic and atraumatic.  Eyes: Conjunctivae and EOM are normal. Right eye exhibits no discharge. Left eye exhibits no discharge. No scleral icterus.  Neck: Normal range of motion. Neck supple. No tracheal deviation present.  Cardiovascular: Normal rate, regular rhythm and normal heart sounds.  Exam reveals no gallop and no friction rub.   No murmur heard. Pulmonary/Chest: Effort normal and  breath sounds normal. No respiratory distress. He has no wheezes.  Abdominal: Soft. He exhibits no distension. There is no tenderness.  Musculoskeletal: Normal range of motion.  Lumbar paraspinal muscles tender to palpation, no bony tenderness, step-offs, or gross abnormality or deformity of spine, patient is able to ambulate, moves all extremities  Bilateral great toe extension intact Bilateral plantar/dorsiflexion intact  Neurological: He is alert and oriented to person, place, and time. He has  normal reflexes.  Sensation and strength intact bilaterally Symmetrical reflexes  Skin: Skin is warm. He is not diaphoretic.  Psychiatric: He has a normal mood and affect. His behavior is normal. Judgment and thought content normal.  Nursing note and vitals reviewed.   ED Course  Procedures (including critical care time) Labs Review Labs Reviewed - No data to display  Imaging Review No results found.   EKG Interpretation None      MDM   Final diagnoses:  Low back pain, unspecified back pain laterality, with sciatica presence unspecified    Patient with back pain.  No neurological deficits and normal neuro exam.  Patient is ambulatory.  No loss of bowel or bladder control.  Doubt cauda equina.  Denies fever,  doubt epidural abscess or other lesion. Recommend back exercises, stretching, RICE.  Encouraged the patient that there could be a need for additional workup and/or imaging such as MRI, if the symptoms do not resolve. Patient advised that if the back pain does not resolve, or radiates, this could progress to more serious conditions and is encouraged to follow-up with PCP or orthopedics within 2 weeks.       Roxy Horseman, PA-C 10/28/14 1610  Nelva Nay, MD 10/29/14 [phone]

## 2015-01-08 ENCOUNTER — Ambulatory Visit: Payer: Self-pay

## 2015-01-15 ENCOUNTER — Ambulatory Visit: Payer: Self-pay | Attending: Internal Medicine

## 2015-06-16 ENCOUNTER — Ambulatory Visit: Payer: Self-pay

## 2015-07-08 ENCOUNTER — Ambulatory Visit: Payer: Self-pay | Admitting: Family Medicine

## 2015-07-14 ENCOUNTER — Ambulatory Visit: Payer: Self-pay

## 2016-10-16 IMAGING — DX DG ELBOW COMPLETE 3+V*L*
4 series · 4 of 4 positions shown · non-contrast
Comparison: None.

CLINICAL DATA: Patient fell off ladder

EXAM:
LEFT ELBOW - COMPLETE 3+ VIEW

[elbow ap]
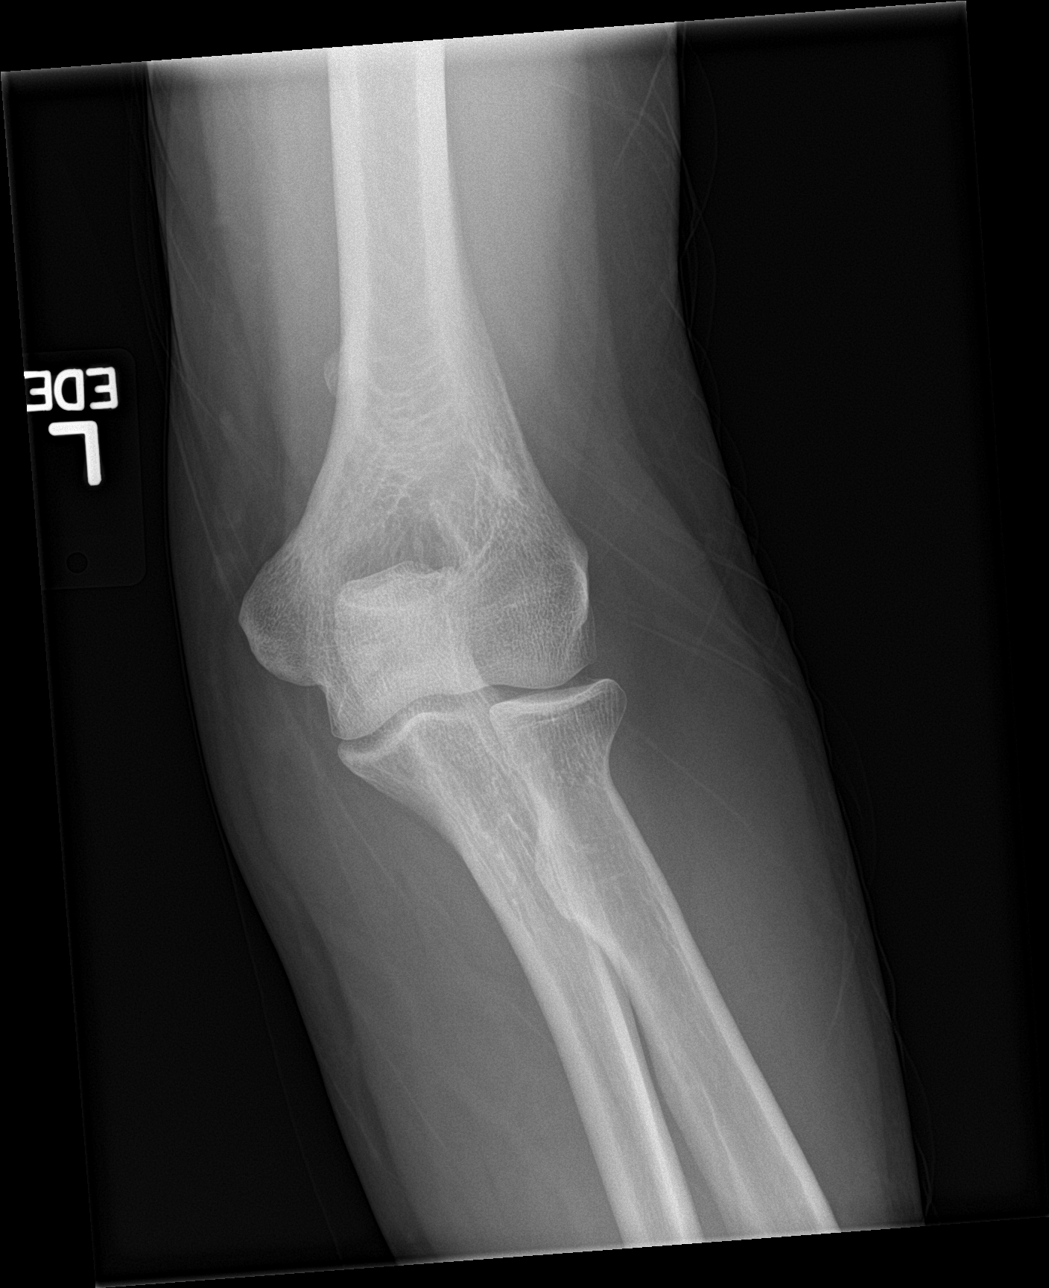

[elbow obl (1 of 2)]
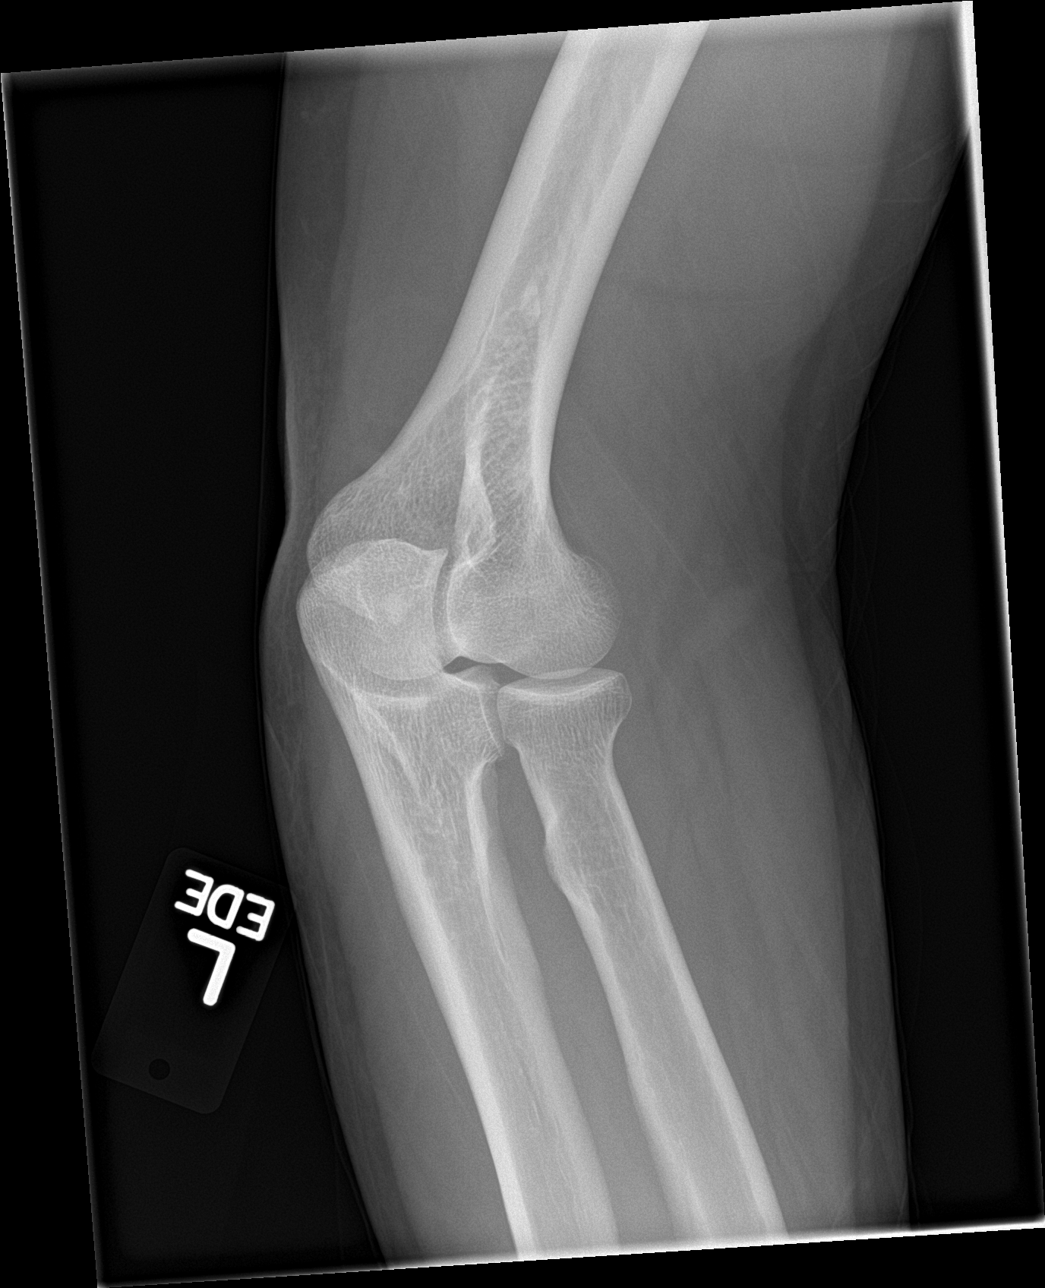

[elbow obl (2 of 2)]
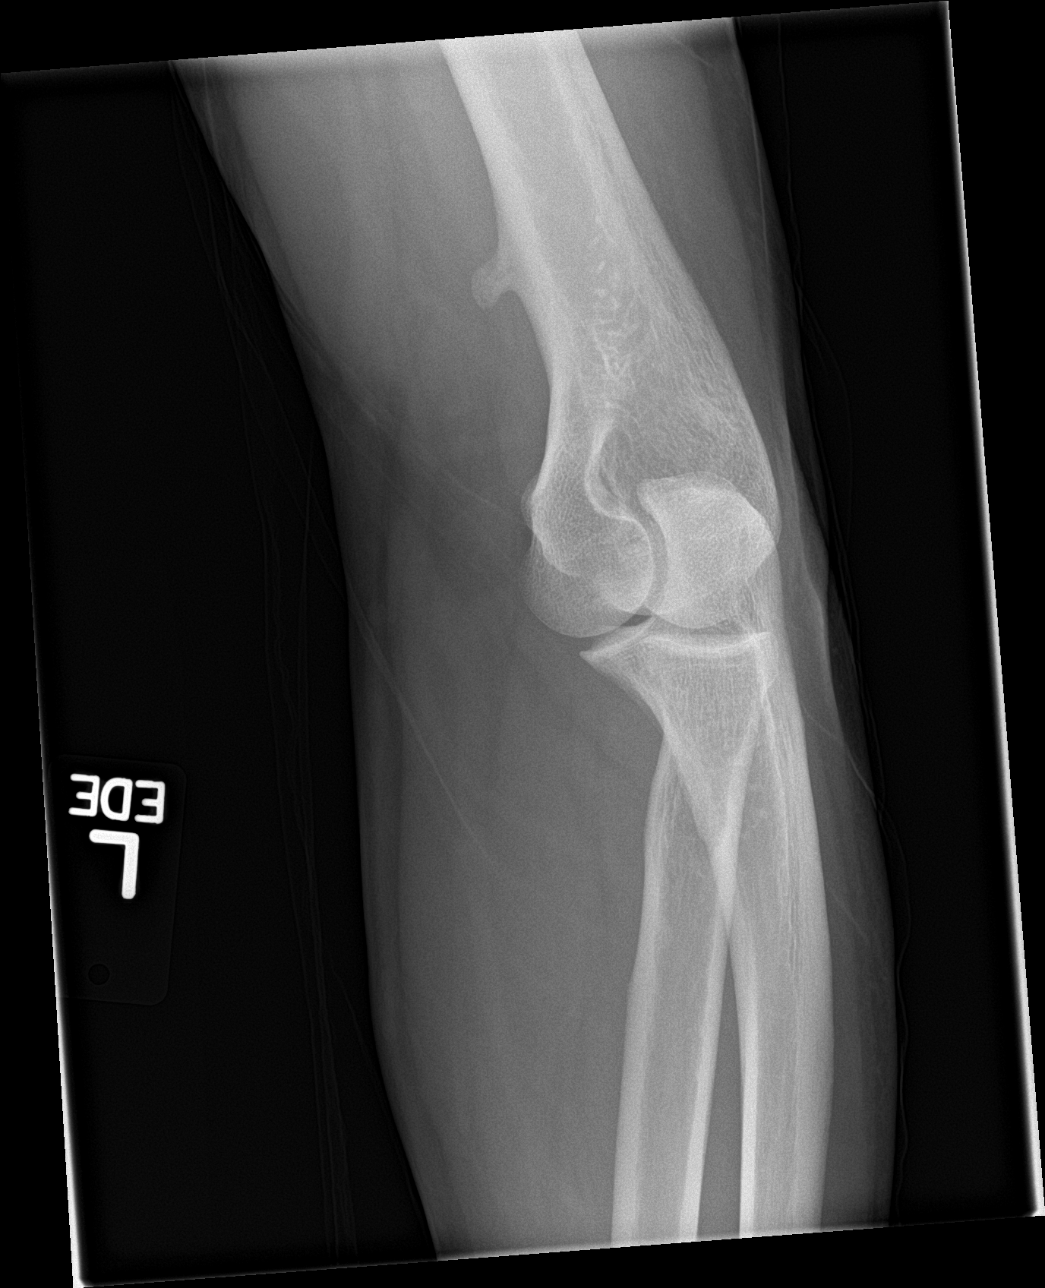

[elbow lat]
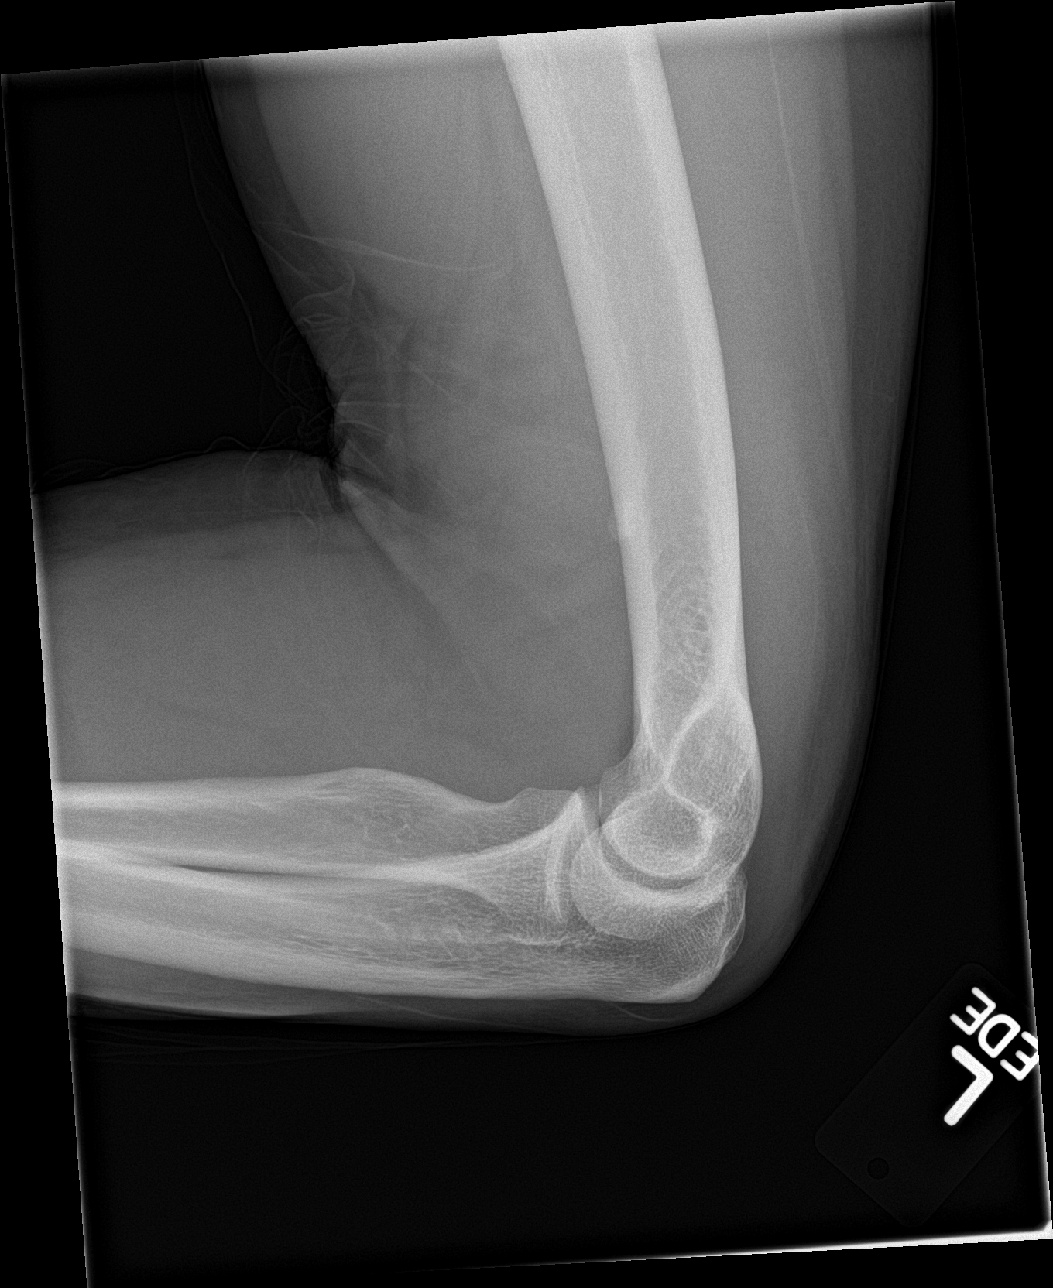

[4 of 4 positions shown; findings below may reference images not displayed]

FINDINGS: Frontal, lateral, and bilateral oblique views were obtained. There
is no fracture or dislocation. No effusion. Joint spaces appear
intact. There is a small benign exostosis arising from the distal
humeral diaphysis.
IMPRESSION: No fracture or dislocation. Small benign exostosis arising from the
distal humerus.

## 2016-12-03 ENCOUNTER — Other Ambulatory Visit (HOSPITAL_COMMUNITY)
Admission: RE | Admit: 2016-12-03 | Discharge: 2016-12-03 | Disposition: A | Discharge status: Home / Self Care | Payer: Self-pay | Source: Ambulatory Visit | Attending: Otolaryngology | Admitting: Otolaryngology

## 2021-11-27 ENCOUNTER — Inpatient Hospital Stay (HOSPITAL_COMMUNITY)
Admission: EM | Admit: 2021-11-27 | Discharge: 2021-11-30 | DRG: 854 | Discharge status: Against Medical Advice | Payer: 59 | Attending: Internal Medicine | Admitting: Internal Medicine

## 2021-11-27 ENCOUNTER — Emergency Department (HOSPITAL_COMMUNITY): Payer: 59

## 2021-11-27 ENCOUNTER — Encounter (HOSPITAL_COMMUNITY): Payer: Self-pay | Admitting: Emergency Medicine

## 2021-11-27 ENCOUNTER — Other Ambulatory Visit: Payer: Self-pay

## 2021-11-27 DIAGNOSIS — R809 Proteinuria, unspecified: Secondary | ICD-10-CM | POA: Diagnosis not present

## 2021-11-27 DIAGNOSIS — A419 Sepsis, unspecified organism: Principal | ICD-10-CM | POA: Diagnosis present

## 2021-11-27 DIAGNOSIS — D649 Anemia, unspecified: Secondary | ICD-10-CM | POA: Diagnosis not present

## 2021-11-27 DIAGNOSIS — R7989 Other specified abnormal findings of blood chemistry: Secondary | ICD-10-CM | POA: Diagnosis present

## 2021-11-27 DIAGNOSIS — E871 Hypo-osmolality and hyponatremia: Secondary | ICD-10-CM | POA: Diagnosis not present

## 2021-11-27 DIAGNOSIS — R000 Tachycardia, unspecified: Secondary | ICD-10-CM | POA: Diagnosis not present

## 2021-11-27 DIAGNOSIS — R509 Fever, unspecified: Secondary | ICD-10-CM | POA: Diagnosis not present

## 2021-11-27 DIAGNOSIS — Z886 Allergy status to analgesic agent status: Secondary | ICD-10-CM

## 2021-11-27 DIAGNOSIS — R69 Illness, unspecified: Secondary | ICD-10-CM | POA: Diagnosis not present

## 2021-11-27 DIAGNOSIS — L02419 Cutaneous abscess of limb, unspecified: Secondary | ICD-10-CM

## 2021-11-27 DIAGNOSIS — L03115 Cellulitis of right lower limb: Secondary | ICD-10-CM | POA: Diagnosis not present

## 2021-11-27 DIAGNOSIS — L02415 Cutaneous abscess of right lower limb: Secondary | ICD-10-CM | POA: Diagnosis not present

## 2021-11-27 DIAGNOSIS — M7041 Prepatellar bursitis, right knee: Secondary | ICD-10-CM | POA: Diagnosis not present

## 2021-11-27 DIAGNOSIS — R609 Edema, unspecified: Secondary | ICD-10-CM | POA: Diagnosis not present

## 2021-11-27 DIAGNOSIS — E876 Hypokalemia: Secondary | ICD-10-CM | POA: Diagnosis not present

## 2021-11-27 DIAGNOSIS — R9431 Abnormal electrocardiogram [ECG] [EKG]: Secondary | ICD-10-CM | POA: Diagnosis not present

## 2021-11-27 DIAGNOSIS — F17210 Nicotine dependence, cigarettes, uncomplicated: Secondary | ICD-10-CM | POA: Diagnosis present

## 2021-11-27 DIAGNOSIS — I10 Essential (primary) hypertension: Secondary | ICD-10-CM | POA: Diagnosis not present

## 2021-11-27 DIAGNOSIS — M25461 Effusion, right knee: Secondary | ICD-10-CM | POA: Diagnosis present

## 2021-11-27 HISTORY — DX: Opioid use, unspecified, uncomplicated: F11.90

## 2021-11-27 HISTORY — DX: Tobacco use: Z72.0

## 2021-11-27 LAB — CBC WITH DIFFERENTIAL/PLATELET
Abs Immature Granulocytes: 0.08 10*3/uL — ABNORMAL HIGH (ref 0.00–0.07)
Basophils Absolute: 0 10*3/uL (ref 0.0–0.1)
Basophils Relative: 0 %
Eosinophils Absolute: 0 10*3/uL (ref 0.0–0.5)
Eosinophils Relative: 0 %
HCT: 37.4 % — ABNORMAL LOW (ref 39.0–52.0)
Hemoglobin: 12.5 g/dL — ABNORMAL LOW (ref 13.0–17.0)
Immature Granulocytes: 1 %
Lymphocytes Relative: 6 %
Lymphs Abs: 0.8 10*3/uL (ref 0.7–4.0)
MCH: 27.1 pg (ref 26.0–34.0)
MCHC: 33.4 g/dL (ref 30.0–36.0)
MCV: 81.1 fL (ref 80.0–100.0)
Monocytes Absolute: 1 10*3/uL (ref 0.1–1.0)
Monocytes Relative: 8 %
Neutro Abs: 10.4 10*3/uL — ABNORMAL HIGH (ref 1.7–7.7)
Neutrophils Relative %: 85 %
Platelets: 224 10*3/uL (ref 150–400)
RBC: 4.61 MIL/uL (ref 4.22–5.81)
RDW: 13.4 % (ref 11.5–15.5)
WBC: 12.2 10*3/uL — ABNORMAL HIGH (ref 4.0–10.5)
nRBC: 0 % (ref 0.0–0.2)

## 2021-11-27 LAB — URINALYSIS, ROUTINE W REFLEX MICROSCOPIC
Bacteria, UA: NONE SEEN
Bilirubin Urine: NEGATIVE
Glucose, UA: NEGATIVE mg/dL
Hgb urine dipstick: NEGATIVE
Ketones, ur: NEGATIVE mg/dL
Leukocytes,Ua: NEGATIVE
Nitrite: NEGATIVE
Protein, ur: 30 mg/dL — AB
Specific Gravity, Urine: 1.016 (ref 1.005–1.030)
pH: 7 (ref 5.0–8.0)

## 2021-11-27 LAB — COMPREHENSIVE METABOLIC PANEL
ALT: 52 U/L — ABNORMAL HIGH (ref 0–44)
AST: 77 U/L — ABNORMAL HIGH (ref 15–41)
Albumin: 3.4 g/dL — ABNORMAL LOW (ref 3.5–5.0)
Alkaline Phosphatase: 177 U/L — ABNORMAL HIGH (ref 38–126)
Anion gap: 10 (ref 5–15)
BUN: 13 mg/dL (ref 6–20)
CO2: 25 mmol/L (ref 22–32)
Calcium: 9 mg/dL (ref 8.9–10.3)
Chloride: 97 mmol/L — ABNORMAL LOW (ref 98–111)
Creatinine, Ser: 0.56 mg/dL — ABNORMAL LOW (ref 0.61–1.24)
GFR, Estimated: >60 mL/min (ref >60)
Glucose, Bld: 132 mg/dL — ABNORMAL HIGH (ref 70–99)
Potassium: 3.5 mmol/L (ref 3.5–5.1)
Sodium: 132 mmol/L — ABNORMAL LOW (ref 135–145)
Total Bilirubin: 0.6 mg/dL (ref 0.3–1.2)
Total Protein: 7.6 g/dL (ref 6.5–8.1)

## 2021-11-27 LAB — LACTIC ACID, PLASMA: Lactic Acid, Venous: 0.9 mmol/L (ref 0.5–1.9)

## 2021-11-27 LAB — C-REACTIVE PROTEIN: CRP: 18.9 mg/dL — ABNORMAL HIGH (ref <1.0)

## 2021-11-27 LAB — PROTIME-INR
INR: 1.1 (ref 0.8–1.2)
Prothrombin Time: 13.6 seconds (ref 11.4–15.2)

## 2021-11-27 MED ORDER — TRAMADOL HCL 50 MG PO TABS: 50.0000 mg | ORAL_TABLET | Freq: Three times a day (TID) | ORAL | Status: DC | PRN

## 2021-11-27 MED ORDER — VANCOMYCIN HCL 500 MG/100ML IV SOLN
500.0000 mg | Freq: Once | INTRAVENOUS | Status: AC
  Administered 2021-11-27: 500 mg via INTRAVENOUS
  Filled 2021-11-27: qty 100

## 2021-11-27 MED ORDER — HYDROMORPHONE HCL 1 MG/ML IJ SOLN
1.0000 mg | Freq: Once | INTRAMUSCULAR | Status: AC
  Administered 2021-11-27: 1 mg via INTRAVENOUS
  Filled 2021-11-27: qty 1

## 2021-11-27 MED ORDER — ENOXAPARIN SODIUM 40 MG/0.4ML IJ SOSY
40.0000 mg | PREFILLED_SYRINGE | INTRAMUSCULAR | Status: DC
Start: 2021-11-27 — End: 2021-11-30
  Administered 2021-11-27 – 2021-11-29 (×2): 40 mg via SUBCUTANEOUS
  Filled 2021-11-27 (×2): qty 0.4

## 2021-11-27 MED ORDER — BUPIVACAINE HCL (PF) 0.5 % IJ SOLN
10.0000 mL | Freq: Once | INTRAMUSCULAR | Status: AC
  Administered 2021-11-27: 10 mL
  Filled 2021-11-27: qty 30

## 2021-11-27 MED ORDER — OXYCODONE HCL 5 MG PO TABS
5.0000 mg | ORAL_TABLET | ORAL | Status: DC | PRN
  Administered 2021-11-28: 5 mg via ORAL
  Filled 2021-11-27 (×2): qty 1

## 2021-11-27 MED ORDER — VANCOMYCIN HCL IN DEXTROSE 1-5 GM/200ML-% IV SOLN
1000.0000 mg | Freq: Once | INTRAVENOUS | Status: AC
  Administered 2021-11-27: 1000 mg via INTRAVENOUS
  Filled 2021-11-27: qty 200

## 2021-11-27 MED ORDER — ONDANSETRON HCL 4 MG/2ML IJ SOLN: 4.0000 mg | Freq: Four times a day (QID) | INTRAMUSCULAR | Status: DC | PRN

## 2021-11-27 MED ORDER — VANCOMYCIN HCL 1500 MG/300ML IV SOLN
1500.0000 mg | Freq: Two times a day (BID) | INTRAVENOUS | Status: DC
  Administered 2021-11-28 – 2021-11-30 (×5): 1500 mg via INTRAVENOUS
  Filled 2021-11-27 (×5): qty 300

## 2021-11-27 MED ORDER — SODIUM CHLORIDE 0.9 % IV SOLN
2.0000 g | Freq: Three times a day (TID) | INTRAVENOUS | Status: DC
  Administered 2021-11-27 – 2021-11-30 (×8): 2 g via INTRAVENOUS
  Filled 2021-11-27 (×8): qty 12.5

## 2021-11-27 MED ORDER — BUPIVACAINE HCL 0.5 % IJ SOLN: 10.0000 mL | Freq: Once | INTRAMUSCULAR | Status: DC

## 2021-11-27 MED ORDER — KETOROLAC TROMETHAMINE 30 MG/ML IJ SOLN
30.0000 mg | Freq: Three times a day (TID) | INTRAMUSCULAR | Status: AC
  Administered 2021-11-27 – 2021-11-29 (×6): 30 mg via INTRAVENOUS
  Filled 2021-11-27 (×6): qty 1

## 2021-11-27 MED ORDER — ACETAMINOPHEN 500 MG PO TABS
1000.0000 mg | ORAL_TABLET | Freq: Once | ORAL | Status: AC
  Administered 2021-11-27: 1000 mg via ORAL
  Filled 2021-11-27: qty 2

## 2021-11-27 MED ORDER — LACTATED RINGERS IV BOLUS
1000.0000 mL | Freq: Once | INTRAVENOUS | Status: AC
  Administered 2021-11-27: 1000 mL via INTRAVENOUS

## 2021-11-27 MED ORDER — SODIUM CHLORIDE 0.9 % IV SOLN
2.0000 g | Freq: Once | INTRAVENOUS | Status: AC
  Administered 2021-11-27: 2 g via INTRAVENOUS
  Filled 2021-11-27: qty 12.5

## 2021-11-27 MED ORDER — ONDANSETRON HCL 4 MG PO TABS: 4.0000 mg | ORAL_TABLET | Freq: Four times a day (QID) | ORAL | Status: DC | PRN

## 2021-11-27 NOTE — ED Triage Notes (Signed)
Pt to ER via EMS from home with c/o right knee pain, swelling, redness and warmth.  Pt states approximately 1 1/2 weeks ago was working on a deck and was kneeling on some gravel.  States approximately 1 week ago the knee began to itch and 3 days ago it started to swell.  States yesterday pt was able to "squeeze" the area and a rock was dislodged from the knee.  Pt is IV drug user.  Pt with swelling, redness and warmth from just above knee to mid calf.  Pt reports fever at home.  Other VSS.

## 2021-11-27 NOTE — Procedures (Signed)
Procedure: Right knee aspiration and injection   Indication: Right knee effusion(s)   Surgeon: Charma Igo, PA-C   Assist: None   Anesthesia: Topical refrigerant and 58ml 0.5% Marcaine   EBL: None   Complications: Dry tap   Findings: After risks/benefits explained patient desires to undergo procedure. Consent obtained and time out performed. The right knee was sterilely prepped and aspirated. Could not aspirate fluid despite multiple passes and two entry locations. Pt tolerated the procedure well.       Freeman Caldron, PA-C Orthopedic Surgery [phone]

## 2021-11-27 NOTE — ED Notes (Signed)
Ortho at bedside to perform tap to right knee.

## 2021-11-27 NOTE — ED Provider Notes (Signed)
[facility] DEPT Provider Note   CSN: [id number] Arrival date & time: 11/27/21  9244     History  Chief Complaint  Patient presents with   Knee Pain    John Cabrera is a 51 y.o. male.   Knee Pain   Patient presents to ED via EMS due to right knee pain.  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  States about a week and a half ago he was working on the deck kneeling on gravel, the knee started being painful and itching.  3 days ago he started noticed swelling, he squeezed the area on a rock and purulent drainage came out of the knee.  Starting this morning he noticed it was red, warm and he is completely unable to move it without excruciating pain.  Endorses fever and chill, denies any chest pain or shortness of breath.  The time of which she called EMS the redness over the knee has spread up to his thigh.  Home Medications Prior to Admission medications   Medication Sig Start Date End Date Taking? Authorizing Provider  acetaminophen (TYLENOL) 500 MG tablet Take 1,000 mg by mouth every 6 (six) hours as needed for mild pain.   Yes [provider]  ibuprofen (ADVIL) 200 MG tablet Take 600 mg by mouth every 6 (six) hours as needed for mild pain.   Yes [provider]      Allergies    Asa [aspirin]    Review of Systems   Review of Systems  Physical Exam Updated Vital Signs BP 126/71 (BP Location: Right Arm)   Pulse 89   Temp 99.2 F (37.3 C) (Oral)   Resp (!) 22   Ht 5' 11" (1.803 m)   Wt 86.2 kg   SpO2 97%   BMI 26.50 kg/m  Physical Exam Vitals and nursing note reviewed. Exam conducted with a chaperone present.  Constitutional:      Appearance: Normal appearance. He is ill-appearing.  HENT:     Head: Normocephalic and atraumatic.  Eyes:     General: No scleral icterus.       Right eye: No discharge.        Left eye: No discharge.     Extraocular Movements: Extraocular movements  intact.     Pupils: Pupils are equal, round, and reactive to light.  Cardiovascular:     Rate and Rhythm: Normal rate and regular rhythm.     Pulses: Normal pulses.     Heart sounds: Normal heart sounds. No murmur heard.    No friction rub. No gallop.  Pulmonary:     Effort: Pulmonary effort is normal. No respiratory distress.     Breath sounds: Normal breath sounds.  Abdominal:     General: Abdomen is flat. Bowel sounds are normal. There is no distension.     Palpations: Abdomen is soft.     Tenderness: There is no abdominal tenderness.  Musculoskeletal:        General: Swelling and tenderness present.     Comments: Swelling of right knee with surrounding erythema moving up to the thigh medially.  Very tender to touch, will not tolerate ROM.  Skin:    General: Skin is warm and dry.     Capillary Refill: Capillary refill takes less than 2 seconds.     Coloration: Skin is not jaundiced.     Findings: Erythema present.  Neurological:     Mental Status: He is  alert. Mental status is at baseline.     Coordination: Coordination normal.            ED Results / Procedures / Treatments   Labs (all labs ordered are listed, but only abnormal results are displayed) Labs Reviewed  COMPREHENSIVE METABOLIC PANEL - Abnormal; Notable for the following components:      Result Value   Sodium 132 (*)    Chloride 97 (*)    Glucose, Bld 132 (*)    Creatinine, Ser 0.56 (*)    Albumin 3.4 (*)    AST 77 (*)    ALT 52 (*)    Alkaline Phosphatase 177 (*)    All other components within normal limits  CBC WITH DIFFERENTIAL/PLATELET - Abnormal; Notable for the following components:   WBC 12.2 (*)    Hemoglobin 12.5 (*)    HCT 37.4 (*)    Neutro Abs 10.4 (*)    Abs Immature Granulocytes 0.08 (*)    All other components within normal limits  C-REACTIVE PROTEIN - Abnormal; Notable for the following components:   CRP 18.9 (*)    All other components within normal limits  CULTURE, BLOOD  (ROUTINE X 2)  CULTURE, BLOOD (ROUTINE X 2)  LACTIC ACID, PLASMA  PROTIME-INR  URINALYSIS, ROUTINE W REFLEX MICROSCOPIC  SEDIMENTATION RATE    EKG None  Radiology CT KNEE RIGHT WO CONTRAST  Result Date: 11/27/2021 CLINICAL DATA:  Septic arthritis, right knee effusion EXAM: CT OF THE RIGHT KNEE WITHOUT CONTRAST TECHNIQUE: Multidetector CT imaging of the right knee was performed according to the standard protocol. Multiplanar CT image reconstructions were also generated. RADIATION DOSE REDUCTION: This exam was performed according to the departmental dose-optimization program which includes automated exposure control, adjustment of the mA and/or kV according to patient size and/or use of iterative reconstruction technique. COMPARISON:  None Available. FINDINGS: Bones/Joint/Cartilage No fracture or dislocation. Normal alignment. No joint effusion. 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 Ligaments Ligaments are suboptimally evaluated by CT. Muscles and Tendons Muscles are normal. No muscle atrophy. No intramuscular fluid collection or hematoma. Quadriceps tendon and patellar tendon are intact. Soft tissue Severe prepatellar soft tissue edema with small bubbles of air most concerning for severe cellulitis. Prepatellar ill-defined fluid like collection concerning for a phlegmon or developing abscess. No soft tissue mass. IMPRESSION: 1. Severe prepatellar soft tissue edema with small bubbles of air most concerning for severe cellulitis. Prepatellar ill-defined fluid like collection concerning for a phlegmon or developing abscess. 2. 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 3. No evidence of osteomyelitis or septic arthritis. Electronically Signed   By: Kathreen Devoid M.D.   On: 11/27/2021 12:46   DG Knee Complete 4 Views Right  Result Date: 11/27/2021 CLINICAL DATA:  Several days of right knee pain EXAM: RIGHT KNEE -  COMPLETE 4+ VIEW COMPARISON:  None Available. FINDINGS: No evidence of fracture or dislocation. Small knee joint effusion. No evidence of arthropathy or other focal bone abnormality. Soft tissues are unremarkable. IMPRESSION: No acute osseous abnormality. Electronically Signed   By: Placido Sou M.D.   On: 11/27/2021 12:24    Procedures .Critical Care  Performed by: Sherrill Raring, PA-C Authorized by: Sherrill Raring, PA-C   Critical care provider statement:    Critical care time (minutes):  30   Critical care start time:  11/27/2021 12:30 PM   Critical care end time:  11/27/2021 1:00 PM   Critical care time was exclusive of:  Separately billable procedures and treating other patients and teaching time   Critical care was necessary to treat or prevent imminent or life-threatening deterioration of the following conditions:  Sepsis   Critical care was time spent personally by me on the following activities:  Development of treatment plan with patient or surrogate, discussions with consultants, evaluation of patient's response to treatment, examination of patient, ordering and review of laboratory studies, ordering and review of radiographic studies, ordering and performing treatments and interventions, pulse oximetry, re-evaluation of patient's condition and review of old charts   Care discussed with: admitting provider       Medications Ordered in ED Medications  vancomycin (VANCOCIN) IVPB 1000 mg/200 mL premix (0 mg Intravenous Stopped 11/27/21 1130)  ceFEPIme (MAXIPIME) 2 g in sodium chloride 0.9 % 100 mL IVPB (0 g Intravenous Stopped 11/27/21 1122)  bupivacaine(PF) (MARCAINE) 0.5 % injection 10 mL (10 mLs Infiltration Given by Other 11/27/21 1123)  HYDROmorphone (DILAUDID) injection 1 mg (1 mg Intravenous Given 11/27/21 1128)  acetaminophen (TYLENOL) tablet 1,000 mg (1,000 mg Oral Given 11/27/21 1331)  lactated ringers bolus 1,000 mL (1,000 mLs Intravenous New Bag/Given 11/27/21 1331)    ED Course/  Medical Decision Making/ A&P                           Medical Decision Making Amount and/or Complexity of Data Reviewed Labs: ordered. Radiology: ordered.  Risk OTC drugs. Prescription drug management. Decision regarding hospitalization.   Patient presents with right knee pain.  I am concerned about a septic joint, differential also includes fracture, dislocation, sepsis, gonococcal arthritis  I am concerned about septic knee.  Patient is an IV drug user, his temperature is elevated 99.3 and his pulse rate is high at 96, having nurse check of rectal rate but I suspect patient is septic.  The knee is very erythematous, warmth and slight fluctuance.  There is no areas to the knee not overlying with cellulitis so will not proceed with aspiration of the knee.  We will empirically start patient on IV antibiotics.  Septic labs ordered, knee x-ray ordered.  I ordered lactated Ringer's, vancomycin, cefepime, Dilaudid for pain.  Also ordered Tylenol as reassessed temperature is 101.3.  10:19 AM -Spoke with with Dr. Griffin Basil with orthopedics.  Discussed physical exam and showed pictures.  I am concerned about septic joint, he advised joint aspiration.  He states superior laterally on the photos there is an area of no erythema that would be amenable to joint aspiration.    I reevaluated the patient with my attending, there is definitely erythema surrounding the knee, not comfortable performing joint aspiration.   10:27 AM repaged Dr. Griffin Basil.  He will send the PA for evaluation and possible joint aspiration.  States that if the joint aspiration is positive this will be admission to surgery.  If joint aspiration is negative admission to hospitalist service with Ortho to consult.  Unsuccessful joint aspiration by orthopedic PA.  Paged to IR for joint aspiration under ultrasound guidance.  They requested CT which we are obtaining.  I ordered, viewed and interpreted laboratory work-up.  CBC is with a  leukocytosis 16.1 with neutrophilic predominance.  Patient is mildly anemic at 12.5.  No gross electrolyte derangement, patient does have transaminitis with slightly elevated AST and ALT.  Alk phos slightly elevated 177.  Lactic acid is negative, PT/INR within normal limits.  CRP elevated at 18.9.  Blood culture obtained and pending, sed rate  pending.  Ordered and reviewed plain film of right knee.  No acute fracture, no retained foreign bodies or free air.    CT knee right without contrast ordered, viewed.  Patient has severe prepatellar soft tissue edema with small bubbles of air most concerning for severe cellulitis.  Prepatellar ill defined fluid collection was concerning for phlegmon or developing abscess.  Based on CT, joint aspiration no longer indicated per IR and orthpedics  I discussed the case with PA Orion Crook again, ortho advised admission to hospitalist for IV antibiotics.  They can consult as needed.  I will consult with hospitalist for admission for IV antibiotics due to right prepatellar cellulitis.  I consulted with Dr. Olevia Bowens who accepts the patient.  Discussed HPI, physical exam and plan of care for this patient with attending Marda Stalker. The attending physician evaluated this patient as part of a shared visit and agrees with plan of care.         Final Clinical Impression(s) / ED Diagnoses Final diagnoses:  Cellulitis of knee, right  Prepatellar abscess    Rx / DC Orders ED Discharge Orders     None         Sherrill Raring, PA-C 11/27/21 1402    Tegeler, Gwenyth Allegra, MD 11/27/21 [phone]

## 2021-11-27 NOTE — Consult Note (Signed)
Reason for Consult:Right knee infection Referring Physician: Thayer Ohm Tegeler Time called: 1035 Time at bedside: 1115   John Cabrera is an 51 y.o. male.  HPI: Elih comes in with right knee pain of several days duration. He says it began when he was building a deck and knelt down on some gravel and drove some into the skin of his knee. It created a pustule which then burst. He was tending all this at home but then yesterday developed significant swelling and redness in the leg. He also developed fevers, chills, sweats, and N/V.  History reviewed. No pertinent past medical history.  Past Surgical History:  Procedure Laterality Date   HERNIA REPAIR Left 1916   groin    History reviewed. No pertinent family history.  Social History:  reports that he has been smoking cigarettes. He has been smoking an average of 1 pack per day. He does not have any smokeless tobacco history on file. He reports current alcohol use. He reports current drug use. Drug: IV.  Allergies:  Allergies  Allergen Reactions   Asa [Aspirin] Other (See Comments)    Causes stomach pain    Medications: I have reviewed the patient's current medications.  Results for orders placed or performed during the hospital encounter of 11/27/21 (from the past 48 hour(s))  Comprehensive metabolic panel     Status: Abnormal   Collection Time: 11/27/21  9:59 AM  Result Value Ref Range   Sodium 132 (L) 135 - 145 mmol/L   Potassium 3.5 3.5 - 5.1 mmol/L   Chloride 97 (L) 98 - 111 mmol/L   CO2 25 22 - 32 mmol/L   Glucose, Bld 132 (H) 70 - 99 mg/dL    Comment: Glucose reference range applies only to samples taken after fasting for at least 8 hours.   BUN 13 6 - 20 mg/dL   Creatinine, Ser 7.82 (L) 0.61 - 1.24 mg/dL   Calcium 9.0 8.9 - 95.6 mg/dL   Total Protein 7.6 6.5 - 8.1 g/dL   Albumin 3.4 (L) 3.5 - 5.0 g/dL   AST 77 (H) 15 - 41 U/L   ALT 52 (H) 0 - 44 U/L   Alkaline Phosphatase 177 (H) 38 - 126 U/L   Total Bilirubin 0.6  0.3 - 1.2 mg/dL   GFR, Estimated >21 >30 mL/min    Comment: (NOTE) Calculated using the CKD-EPI Creatinine Equation (2021)    Anion gap 10 5 - 15    Comment: Performed at [facility], [address] [place], [place] [address]  CBC with Differential     Status: Abnormal   Collection Time: 11/27/21  9:59 AM  Result Value Ref Range   WBC 12.2 (H) 4.0 - 10.5 K/uL   RBC 4.61 4.22 - 5.81 MIL/uL   Hemoglobin 12.5 (L) 13.0 - 17.0 g/dL   HCT 46.9 (L) 62.9 - 52.8 %   MCV 81.1 80.0 - 100.0 fL   MCH 27.1 26.0 - 34.0 pg   MCHC 33.4 30.0 - 36.0 g/dL   RDW 41.3 24.4 - 01.0 %   Platelets 224 150 - 400 K/uL   nRBC 0.0 0.0 - 0.2 %   Neutrophils Relative % 85 %   Neutro Abs 10.4 (H) 1.7 - 7.7 K/uL   Lymphocytes Relative 6 %   Lymphs Abs 0.8 0.7 - 4.0 K/uL   Monocytes Relative 8 %   Monocytes Absolute 1.0 0.1 - 1.0 K/uL   Eosinophils Relative 0 %   Eosinophils Absolute 0.0  0.0 - 0.5 K/uL   Basophils Relative 0 %   Basophils Absolute 0.0 0.0 - 0.1 K/uL   Immature Granulocytes 1 %   Abs Immature Granulocytes 0.08 (H) 0.00 - 0.07 K/uL    Comment: Performed at [facility], [address] [place], [place] [address]  Protime-INR     Status: None   Collection Time: 11/27/21  9:59 AM  Result Value Ref Range   Prothrombin Time 13.6 11.4 - 15.2 seconds   INR 1.1 0.8 - 1.2    Comment: (NOTE) INR goal varies based on device and disease states. Performed at [facility], [address] [place], [place] [address]   Lactic acid, plasma     Status: None   Collection Time: 11/27/21 10:00 AM  Result Value Ref Range   Lactic Acid, Venous 0.9 0.5 - 1.9 mmol/L    Comment: Performed at [facility], [address], [place], [place] [address]    No results found.  Review of Systems  Constitutional:  Positive for chills, diaphoresis and fever.  HENT:  Negative for ear discharge, ear pain, hearing loss and tinnitus.   Eyes:   Negative for photophobia and pain.  Respiratory:  Negative for cough and shortness of breath.   Cardiovascular:  Negative for chest pain.  Gastrointestinal:  Negative for abdominal pain, nausea and vomiting.  Genitourinary:  Negative for dysuria, flank pain, frequency and urgency.  Musculoskeletal:  Positive for arthralgias (Right knee). Negative for back pain, myalgias and neck pain.  Neurological:  Negative for dizziness and headaches.  Hematological:  Does not bruise/bleed easily.  Psychiatric/Behavioral:  The patient is not nervous/anxious.    Blood pressure 136/84, pulse 90, temperature (!) 101.3 F (38.5 C), temperature source Rectal, resp. rate 17, height 5\' 11"  (1.803 m), weight 86.2 kg, SpO2 92 %. Physical Exam Constitutional:      General: He is not in acute distress.    Appearance: He is well-developed. He is not diaphoretic.  HENT:     Head: Normocephalic and atraumatic.  Eyes:     General: No scleral icterus.       Right eye: No discharge.        Left eye: No discharge.     Conjunctiva/sclera: Conjunctivae normal.  Cardiovascular:     Rate and Rhythm: Normal rate and regular rhythm.  Pulmonary:     Effort: Pulmonary effort is normal. No respiratory distress.  Musculoskeletal:     Cervical back: Normal range of motion.     Comments: RLE No traumatic wounds or ecchymosis. Dried pustule over patella, severe TTP general knee, surrounding erythema, severe pain if AROM >15 degrees  Prob mod knee effusion  Sens DPN, SPN, TN intact  Motor EHL, ext, flex, evers 5/5  DP 2+, PT 2+, No significant edema  Skin:    General: Skin is warm and dry.  Neurological:     Mental Status: He is alert.  Psychiatric:        Mood and Affect: Mood normal.        Behavior: Behavior normal.     Assessment/Plan: Right knee infection -- Was unable to aspirate any fluid. Clinically though I'm still worried about septic arthritis so will ask IR to see if they can aspirate. Please keep  NPO.    , PA-C Orthopedic Surgery [phone] 11/27/2021, 11:41 AM

## 2021-11-27 NOTE — Progress Notes (Signed)
IR was requested for image guided right knee aspiration.   Case was reviewed by Dr. Ova Freshwater, ultrasound or CT of the right knee for further evaluation was recommended.  CT reviewed by Dr. Marene Lenz, no joint effusion seen.    Ordering provider notified.   Will delete the right knee aspiration order.  Please call IR/ radiology for questions and concerns.   John Cabrera H [name] PA-C 11/27/2021 1:00 PM

## 2021-11-27 NOTE — H&P (Signed)
History and Physical    Patient: John Cabrera VFI:[id number] DOB: June 05, 1970 DOA: 11/27/2021 DOS: the patient was seen and examined on 11/27/2021 PCP: Pcp, No  Patient coming from: Home  Chief Complaint:  Chief Complaint  Patient presents with   Knee Pain   HPI: John Cabrera is a 51 y.o. male with medical history significant of left inguinal hernia with left inguinal hernia repair, tobacco use, heroin use, alcohol use who came into the emergency department with complaints of progressively worse right knee pain, edema, erythema, calor and TTP for over a week after he was on his knees at work and believes there was a small foreign body in the area.  He developed a small abscess.  He "squeezed it" yesterday, but then it has become a lot worse today.  He has had fever and chills.  He had 2 episodes of emesis this morning. He denied hinorrhea, sore throat, wheezing or hemoptysis.  No chest pain, palpitations, diaphoresis, PND, orthopnea or pitting edema of the lower extremities.  No abdominal pain, diarrhea, constipation, melena or hematochezia.  No flank pain, dysuria, frequency or hematuria.  No polyuria, polydipsia, polyphagia or blurred vision.   ED course: The patient received vancomycin, cefepime and hydromorphone.  Lab work: CBC with a white count 12.2, hemoglobin 12.5 g/dL and platelets 2 416.  Normal PT and INR.  CRP 18.9.  Urine analysis was normal aside for proteinuria 30 mg/dL.  CMP with a sodium 132, potassium 3.5 and chloride 97 mmol/L.  Renal function is normal.  Glucose 132 mg/dL.  Albumin 3.4 g/dL.  AST 77, ALT 52 and alkaline phosphatase 177 with a normal total bilirubin.  Imaging: CT right knee with severe prepatellar soft tissue edema with small bubbles of air most concerning for severe cellulitis.  There is prepatellar ill-defined fluidlike collection concerning for phlegmon or developing abscess.   Review of Systems: As mentioned in the history of present illness. All other  systems reviewed and are negative.  History reviewed. No pertinent past medical history. Past Surgical History:  Procedure Laterality Date   HERNIA REPAIR Left 1916   groin   Social History:  reports that he has been smoking cigarettes. He has been smoking an average of 1 pack per day. He does not have any smokeless tobacco history on file. He reports current alcohol use. He reports current drug use. Drug: IV.  Allergies  Allergen Reactions   Asa [Aspirin] Other (See Comments)    Causes stomach pain    History reviewed. No pertinent family history.  Prior to Admission medications   Medication Sig Start Date End Date Taking? Authorizing Provider  acetaminophen (TYLENOL) 500 MG tablet Take 1,000 mg by mouth every 6 (six) hours as needed for mild pain.   Yes [provider]  ibuprofen (ADVIL) 200 MG tablet Take 600 mg by mouth every 6 (six) hours as needed for mild pain.   Yes [provider]    Physical Exam: Vitals:   11/27/21 1100 11/27/21 1119 11/27/21 1130 11/27/21 1308  BP: 136/84  135/80 126/71  Pulse: 90  86 89  Resp: 17  (!) 9 (!) 22  Temp:  (!) 101.3 F (38.5 C)  99.2 F (37.3 C)  TempSrc:  Rectal  Oral  SpO2: 92%  98% 97%  Weight:      Height:       Physical Exam Vitals and nursing note reviewed.  Constitutional:      Appearance: Normal appearance. He is ill-appearing. He is  not toxic-appearing.  HENT:     Head: Normocephalic.     Mouth/Throat:     Mouth: Mucous membranes are moist.  Eyes:     General: No scleral icterus.    Pupils: Pupils are equal, round, and reactive to light.  Neck:     Vascular: No JVD.  Cardiovascular:     Rate and Rhythm: Normal rate and regular rhythm.     Heart sounds: S1 normal and S2 normal.  Pulmonary:     Effort: Pulmonary effort is normal.     Breath sounds: Normal breath sounds.  Abdominal:     General: Bowel sounds are normal. There is no distension.     Palpations: Abdomen is soft.     Tenderness:  There is no abdominal tenderness. There is no right CVA tenderness, left CVA tenderness or guarding.  Musculoskeletal:     Cervical back: Neck supple.     Right lower leg: No edema.     Left lower leg: No edema.  Skin:    General: Skin is warm and dry.     Findings: Erythema and wound present.     Comments: Positive erythema, edema, calor and TTP on right knee extending to his inner thighs and pretibial area.  Neurological:     General: No focal deficit present.     Mental Status: He is alert and oriented to person, place, and time.  Psychiatric:        Mood and Affect: Mood normal.        Behavior: Behavior normal.    Data Reviewed:  There are no new results to review at this time.  Assessment and Plan: Principal Problem:   Cellulitis of right knee Admit to MedSurg/inpatient. Continue IV fluids. Continue cefepime 2 g every 8 hours.   Continue vancomycin per pharmacy. Follow wound culture and sensitivity. Follow-up blood culture and sensitivity Follow CBC and CMP in a.m. IR and orthopedic surgery input appreciated.  Active Problems:   Abnormal LFTs Has been checked for hepatitis. Monitor LFTs.    Normocytic anemia Monitor hematocrit and hemoglobin.    Mild hyponatremia Likely GI losses from vomiting. Follow sodium level.      Advance Care Planning:   Code Status: Full Code   Consults: Orthopedic surgery and interventional radiology.  Family Communication:   Severity of Illness: The appropriate patient status for this patient is INPATIENT. Inpatient status is judged to be reasonable and necessary in order to provide the required intensity of service to ensure the patient's safety. The patient's presenting symptoms, physical exam findings, and initial radiographic and laboratory data in the context of their chronic comorbidities is felt to place them at high risk for further clinical deterioration. Furthermore, it is not anticipated that the patient will be  medically stable for discharge from the hospital within 2 midnights of admission.   * I certify that at the point of admission it is my clinical judgment that the patient will require inpatient hospital care spanning beyond 2 midnights from the point of admission due to high intensity of service, high risk for further deterioration and high frequency of surveillance required.*  Author: Bobette Mo, MD 11/27/2021 3:59 PM  For on call review [email or web address].   This document was prepared using Dragon voice recognition software and may contain some unintended transcription errors.

## 2021-11-27 NOTE — ED Notes (Signed)
Patient transported to CT 

## 2021-11-27 NOTE — Progress Notes (Signed)
Pharmacy Antibiotic Note  John Cabrera is a 51 y.o. male admitted on 11/27/2021 with cellulitis.  Pharmacy has been consulted for vancomycin and cefepime dosing.  Concern for septic arthritis after pt fell and had gravel driven into the right knee.    Plan: Cefepime 2 gr IV q8h  Vancomycin 1000 mg IV x1 given in ED. Ordered another 500 mg dose for a total 1500 mg loading dose, then vancomycin 1500 mg IV q12h  Monitor clinical course, renal function, cultures as available   Height: 5\' 11"  (180.3 cm) Weight: 86.2 kg (190 lb) IBW/kg (Calculated) : 75.3  Temp (24hrs), Avg:99.9 F (37.7 C), Min:99.2 F (37.3 C), Max:101.3 F (38.5 C)  Recent Labs  Lab 11/27/21 0959 11/27/21 1000  WBC 12.2*  --   CREATININE 0.56*  --   LATICACIDVEN  --  0.9    Estimated Creatinine Clearance: 115 mL/min (A) (by C-G formula based on SCr of 0.56 mg/dL (L)).    Allergies  Allergen Reactions   Asa [Aspirin] Other (See Comments)    Causes stomach pain    Antimicrobials this admission: 7/28 vancomycin >>  7/28 cefepime >>   Dose adjustments this admission:   Microbiology results: 7/28 BCx:    8/28, PharmD, BCPS 11/27/2021 3:22 PM

## 2021-11-28 DIAGNOSIS — L03115 Cellulitis of right lower limb: Secondary | ICD-10-CM | POA: Diagnosis not present

## 2021-11-28 DIAGNOSIS — A419 Sepsis, unspecified organism: Secondary | ICD-10-CM

## 2021-11-28 LAB — RAPID URINE DRUG SCREEN, HOSP PERFORMED
Amphetamines: NOT DETECTED
Barbiturates: NOT DETECTED
Benzodiazepines: POSITIVE — AB
Cocaine: NOT DETECTED
Opiates: NOT DETECTED
Tetrahydrocannabinol: NOT DETECTED

## 2021-11-28 LAB — HEPATITIS PANEL, ACUTE
HCV Ab: REACTIVE — AB
Hep A IgM: NONREACTIVE
Hep B C IgM: NONREACTIVE
Hepatitis B Surface Ag: NONREACTIVE

## 2021-11-28 LAB — MRSA NEXT GEN BY PCR, NASAL: MRSA by PCR Next Gen: NOT DETECTED

## 2021-11-28 LAB — HIV ANTIBODY (ROUTINE TESTING W REFLEX): HIV Screen 4th Generation wRfx: NONREACTIVE

## 2021-11-28 LAB — CULTURE, BLOOD (ROUTINE X 2)

## 2021-11-28 LAB — SEDIMENTATION RATE: Sed Rate: 78 mm/hr — ABNORMAL HIGH (ref 0–16)

## 2021-11-28 MED ORDER — ACETAMINOPHEN 500 MG PO TABS
1000.0000 mg | ORAL_TABLET | Freq: Four times a day (QID) | ORAL | Status: DC
  Administered 2021-11-28 – 2021-11-29 (×4): 1000 mg via ORAL
  Filled 2021-11-28 (×5): qty 2

## 2021-11-28 MED ORDER — ACETAMINOPHEN 325 MG PO TABS
650.0000 mg | ORAL_TABLET | Freq: Four times a day (QID) | ORAL | Status: DC | PRN
  Administered 2021-11-28: 650 mg via ORAL
  Filled 2021-11-28: qty 2

## 2021-11-28 NOTE — Hospital Course (Signed)
51 y.o.m w/ left inguinal hernia s/p repair, tobacco use, heroin use, alcohol use presenting with right knee pain edema redness over a week after possible small foreign body in the knee at work and developed a small abscess.  He "squeezed it" 7/27 but then it has become a lot worse In ED-Labs showed leukocytosis 12.2, hemoglobin 12.5 g/dL and platelets 2 665.  Normal PT and INR.  CRP 18.9.  Urine analysis was normal aside for proteinuria 30 mg/dL.  CMP mild hyponatremia hypokalemia normal renal function mild transaminitis AST 77, ALT 52 and alkaline phosphatase 177 with a normal total bilirubin. CT right knee with severe prepatellar soft tissue edema with small bubbles of air most concerning for severe cellulitis.  There is prepatellar ill-defined fluidlike collection concerning for phlegmon or developing abscess. Orthopedic surgery was consulted-attempted to aspirate  but unable - consulted IR, patient was admitted on broad-spectrum antibiotics

## 2021-11-28 NOTE — Progress Notes (Signed)
Subjective: Patient reports pain as marked. Feels no different than yesterday. Denies fever but endorses chills. Some nausea earlier but better now. Tolerating diet.  Urinating. No CP, SOB. Has not worked with PT/OT on mobilizing OOB but has no WB restrictions currently so able to WBAT RLE.   Reports last used heroin 11/26/21. His girlfriend was found down on the floor of his room this morning and rapid response had to be called. She was transported to the ED. Unknown if she or he used any outside drugs while in the room.   CT right knee shows severe prepatellar soft tissue edema with small bubbles of air most concerning for severe cellulitis. Prepatellar ill-defined fluid like collection concerning for a phlegmon or developing abscess.  Objective:   VITALS:   Vitals:   11/27/21 2133 11/28/21 0145 11/28/21 0516 11/28/21 1054  BP: 130/76 (!) 120/54 124/67 132/76  Pulse: 89 93 91 95  Resp: 17 17 16 16   Temp: 98.3 F (36.8 C) 97.8 F (36.6 C) 98.3 F (36.8 C) 98.4 F (36.9 C)  TempSrc:   Oral Oral  SpO2: 100% 100% 100% 97%  Weight:      Height:          Latest Ref Rng & Units 11/27/2021    9:59 AM  CBC  WBC 4.0 - 10.5 K/uL 12.2   Hemoglobin 13.0 - 17.0 g/dL 11/29/2021   Hematocrit 76.7 - 52.0 % 37.4   Platelets 150 - 400 K/uL 224       Latest Ref Rng & Units 11/27/2021    9:59 AM  BMP  Glucose 70 - 99 mg/dL 11/29/2021   BUN 6 - 20 mg/dL 13   Creatinine 937 - 1.24 mg/dL 9.02   Sodium 4.09 - 735 mmol/L 132   Potassium 3.5 - 5.1 mmol/L 3.5   Chloride 98 - 111 mmol/L 97   CO2 22 - 32 mmol/L 25   Calcium 8.9 - 10.3 mg/dL 9.0    Intake/Output      07/28 0701 07/29 0700 07/29 0701 07/30 0700   P.O.  180   Total Intake(mL/kg)  180 (2.1)   Net  +180           Physical Exam: General: NAD.  Sitting up in bed, calm, ill-appearing, disheveled  Resp: No increased wob Cardio: regular rate and rhythm ABD soft Neurologically intact MSK TTP right anterior and medial knee Right  thigh and calf non-TTP NVI Knee ROM limited but intact Dorsiflexion/Plantar flexion intact Skin of RLE warm to the touch Erythema beginning to spread proximal up medial right thigh beyond the outlined area towards groin  Open sore on anterior aspect of knee without current discharge Some areas of fluctuance around open sore and on anteromedial tibia  See images below  Media From this encounter Scan on 11/28/2021 12:33 PM by 11/30/2021, PA-C  Scan on 11/28/2021 12:32 PM by 11/30/2021, PA-C  Scan on 11/28/2021 12:31 PM by 11/30/2021, PA-C  Scan on 11/28/2021 12:31 PM by 11/30/2021, PA-C                     Assessment:    Principal Problem:   Cellulitis of right knee Active Problems:   Abnormal LFTs   Normocytic anemia   Hyponatremia   Sepsis (HCC)    Plan: Erythema spreading up thigh with areas of obvious fluctuance indicating worsening infection despite ABX. After a lengthy discussion with the  patient about the risks and benefits of non-operative vs operative treatment, he has agreed to proceed with surgery. Will plan to take the patient to the OR Sunday morning for I&D right knee. May need to place a wound vac.   Increase Tylenol to 1000mg  q6h Continue to restrict visitors  Continue ABX for cellulitis Incentive Spirometry Elevate and Apply ice PRN  Weightbearing: WBAT RLE Insicional and dressing care: continue to monitor for drainage and changes in redness Orthopedic device(s): None Showering: Keep dressing dry VTE prophylaxis: Lovenox 40mg  qd  while in patient , SCDs, ambulation IV ABX: Cefepime and Vanc Pain control: Toradol q8h, Oxy PRN; minimize narcotics as much as possible as patient is active IV drug user Follow - up plan:  TBD Contact information:  MD, PA-C  Dispo:  TBD     Ramond Marrow, [place] [facility] [phone] 11/28/2021, 11:26 AM

## 2021-11-28 NOTE — Progress Notes (Signed)
Called to room by [facility]. Family member found on the ground unresponsive. ED staff at bedside and patient lifted to stretcher. Transported to ED by staff.

## 2021-11-28 NOTE — Plan of Care (Signed)
  Problem: Clinical Measurements: Goal: Ability to avoid or minimize complications of infection will improve Outcome: Progressing   

## 2021-11-28 NOTE — Progress Notes (Signed)
Patient's significant other found in the floor this morning at approximately 1000 today by attending physician, Dayna Barker, MD. Rapid response and the Emergency Department were called, and patient was transported to the ED by ER employee.  Because of this event, patient is no longer allowed visitors.   I then called the front desk to alert them of this change.   SWhittemore, Charity fundraiser

## 2021-11-28 NOTE — Progress Notes (Signed)
PROGRESS NOTE John Cabrera  GGE:[id number] DOB: 1970-09-16 DOA: 11/27/2021 PCP: Pcp, No   Brief Narrative/Hospital Course: 51 y.o.m w/ left inguinal hernia s/p repair, tobacco use, heroin use, alcohol use presenting with right knee pain edema redness over a week after possible small foreign body in the knee at work and developed a small abscess.  He "squeezed it" 7/27 but then it has become a lot worse In ED-Labs showed leukocytosis 12.2, hemoglobin 12.5 g/dL and platelets 2 224.  Normal PT and INR.  CRP 18.9.  Urine analysis was normal aside for proteinuria 30 mg/dL.  CMP mild hyponatremia hypokalemia normal renal function mild transaminitis AST 77, ALT 52 and alkaline phosphatase 177 with a normal total bilirubin. CT right knee with severe prepatellar soft tissue edema with small bubbles of air most concerning for severe cellulitis.  There is prepatellar ill-defined fluidlike collection concerning for phlegmon or developing abscess. Orthopedic surgery was consulted-attempted to aspirate  but unable - consulted IR, patient was admitted on broad-spectrum antibiotics     Subjective: Seen and examined this morning. Right knee redness appears better Overnight afebrile 101.3 on admission Called rapid response as his golf and was found on the floor he states he fell patient was drowsy, there are spots of blood marks possible track mark on her right hand. She is in the process to be transferred to ER for evaluation. Patient informed that he will not have any more visitors and security to scan his room. One to one tele sitter will be ordered. He admits doing drugs recently  Assessment and Plan: Principal Problem:   Cellulitis of right knee Active Problems:   Abnormal LFTs   Normocytic anemia   Hyponatremia   Sepsis ([place])   Cellulitis of right knee Sepsis poa 2/2 above: With fever of 101.3 heart rate 96, leukocytosis met sepsis criteria on admission but normal lactic acid, vitals stable.  CT  shows severe cellulitis, possible phlegmon/developing abscess.  Rule out septic arthritis-Ortho IR consulted-unable to aspirate in the ED by Ortho-IR reviewed CT no joint effusion-plan per ortho. Continue cefepime, vancomycin, Toradol for pain control.  Minimize narcotics.  Follow-up blood cultures given IVDA. Recent Labs  Lab 11/27/21 0959 11/27/21 1000  WBC 12.2*  --   LATICACIDVEN  --  0.9   IVDA history, counseled strongly, TeleSitter, no visitor.ordered UDS. Abnormal LFTs-trend.  Check acute viral panel Normocytic anemia-monitor Mild Hyponatremia-monitor  DVT prophylaxis: enoxaparin (LOVENOX) injection 40 mg Start: 11/27/21 1700 Code Status:   Code Status: Full Code Family Communication: plan of care discussed with patient at bedside. Patient status is: Inpatient because of sepsis with right cellulitis Level of care: Med-Surg   Dispo: The patient is from: home            Anticipated disposition: TBD  Mobility Assessment (last 72 hours)     Mobility Assessment   No documentation.          Objective: Vitals last 24 hrs: Vitals:   11/27/21 1730 11/27/21 2133 11/28/21 0145 11/28/21 0516  BP: 117/77 130/76 (!) 120/54 124/67  Pulse: 70 89 93 91  Resp: _0 Temp:  98.3 F (36.8 C) 97.8 F (36.6 C) 98.3 F (36.8 C)  TempSrc:    Oral  SpO2: 92% 100% 100% 100%  Weight:      Height:       Weight change:   Physical Examination: General exam: alert awake,older than stated age, weak appearing. HEENT:Oral mucosa moist, Ear/Nose WNL grossly, dentition normal.  Respiratory system: bilaterally diminished BS, no use of accessory muscle Cardiovascular system: S1 & S2 +, No JVD. Gastrointestinal system: Abdomen soft,NT,ND, BS+ Nervous System:Alert, awake, moving extremities and grossly nonfocal Extremities: LE edema ON RT KNEE WITH tenderness erythema,distal peripheral pulses palpable.  Skin: No rashes,no icterus. MSK: Normal muscle bulk,tone, power   First pic on  admit, 2nd pic  7/29  Medications reviewed:  Scheduled Meds:  enoxaparin (LOVENOX) injection  40 mg Subcutaneous Q24H   ketorolac  30 mg Intravenous Q8H   Continuous Infusions:  ceFEPime (MAXIPIME) IV 2 g (11/28/21 0137)   vancomycin 1,500 mg (11/28/21 0511)      Diet Order             Diet Heart Room service appropriate? Yes; Fluid consistency: Thin  Diet effective now                   No intake or output data in the 24 hours ending 11/28/21 0823 Net IO Since Admission: No IO data has been entered for this period [11/28/21 0823]  Wt Readings from Last 3 Encounters:  11/27/21 86.2 kg  10/28/14 97.5 kg  06/17/14 83.9 kg     Unresulted Labs (From admission, onward)     Start     Ordered   12/04/21 0500  Creatinine, serum  (enoxaparin (LOVENOX)    CrCl >/= 30 ml/min)  Weekly,   R     Comments: while on enoxaparin therapy    11/27/21 1440   11/27/21 1438  HIV Antibody (routine testing w rflx)  (HIV Antibody (Routine testing w reflex) panel)  Once,   R        11/27/21 1440   11/27/21 0953  Culture, blood (Routine x 2)  BLOOD CULTURE X 2,   R      11/27/21 0953          Data Reviewed: I have personally reviewed following labs and imaging studies CBC: Recent Labs  Lab 11/27/21 0959  WBC 12.2*  NEUTROABS 10.4*  HGB 12.5*  HCT 37.4*  MCV 81.1  PLT 224   Basic Metabolic Panel: Recent Labs  Lab 11/27/21 0959  NA 132*  K 3.5  CL 97*  CO2 25  GLUCOSE 132*  BUN 13  CREATININE 0.56*  CALCIUM 9.0   GFR: Estimated Creatinine Clearance: 115 mL/min (A) (by C-G formula based on SCr of 0.56 mg/dL (L)). Liver Function Tests: Recent Labs  Lab 11/27/21 0959  AST 77*  ALT 52*  ALKPHOS 177*  BILITOT 0.6  PROT 7.6  ALBUMIN 3.4*   No results for input(s): "LIPASE", "AMYLASE" in the last 168 hours. No results for input(s): "AMMONIA" in the last 168 hours. Coagulation Profile: Recent Labs  Lab 11/27/21 0959  INR 1.1   BNP (last 3 results) No results  for input(s): "PROBNP" in the last 8760 hours. HbA1C: No results for input(s): "HGBA1C" in the last 72 hours. CBG: No results for input(s): "GLUCAP" in the last 168 hours. Lipid Profile: No results for input(s): "CHOL", "HDL", "LDLCALC", "TRIG", "CHOLHDL", "LDLDIRECT" in the last 72 hours. Thyroid Function Tests: No results for input(s): "TSH", "T4TOTAL", "FREET4", "T3FREE", "THYROIDAB" in the last 72 hours. Sepsis Labs: Recent Labs  Lab 11/27/21 1000  LATICACIDVEN 0.9    No results found for this or any previous visit (from the past 240 hour(s)).  Antimicrobials: Anti-infectives (From admission, onward)    Start     Dose/Rate Route Frequency Ordered Stop   11/28/21 0400    vancomycin (VANCOREADY) IVPB 1500 mg/300 mL        1,500 mg 150 mL/hr over 120 Minutes Intravenous Every 12 hours 11/27/21 1523     11/27/21 1800  ceFEPIme (MAXIPIME) 2 g in sodium chloride 0.9 % 100 mL IVPB        2 g 200 mL/hr over 30 Minutes Intravenous Every 8 hours 11/27/21 1523     11/27/21 1500  vancomycin (VANCOREADY) IVPB 500 mg/100 mL        500 mg 100 mL/hr over 60 Minutes Intravenous  Once 11/27/21 1447 11/27/21 1728   11/27/21 1015  vancomycin (VANCOCIN) IVPB 1000 mg/200 mL premix        1,000 mg 200 mL/hr over 60 Minutes Intravenous  Once 11/27/21 1004 11/27/21 1130   11/27/21 1015  ceFEPIme (MAXIPIME) 2 g in sodium chloride 0.9 % 100 mL IVPB        2 g 200 mL/hr over 30 Minutes Intravenous  Once 11/27/21 1004 11/27/21 1122      Culture/Microbiology No results found for: "SDES", "SPECREQUEST", "CULT", "REPTSTATUS"  Other culture-see note  Radiology Studies: CT KNEE RIGHT WO CONTRAST  Result Date: 11/27/2021 CLINICAL DATA:  Septic arthritis, right knee effusion EXAM: CT OF THE RIGHT KNEE WITHOUT CONTRAST TECHNIQUE: Multidetector CT imaging of the right knee was performed according to the standard protocol. Multiplanar CT image reconstructions were also generated. RADIATION DOSE REDUCTION:  This exam was performed according to the departmental dose-optimization program which includes automated exposure control, adjustment of the mA and/or kV according to patient size and/or use of iterative reconstruction technique. COMPARISON:  None Available. FINDINGS: Bones/Joint/Cartilage No fracture or dislocation. Normal alignment. No joint effusion. 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 Ligaments Ligaments are suboptimally evaluated by CT. Muscles and Tendons Muscles are normal. No muscle atrophy. No intramuscular fluid collection or hematoma. Quadriceps tendon and patellar tendon are intact. Soft tissue Severe prepatellar soft tissue edema with small bubbles of air most concerning for severe cellulitis. Prepatellar ill-defined fluid like collection concerning for a phlegmon or developing abscess. No soft tissue mass. IMPRESSION: 1. Severe prepatellar soft tissue edema with small bubbles of air most concerning for severe cellulitis. Prepatellar ill-defined fluid like collection concerning for a phlegmon or developing abscess. 2. 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 3. No evidence of osteomyelitis or septic arthritis. Electronically Signed   By: Kathreen Devoid M.D.   On: 11/27/2021 12:46   DG Knee Complete 4 Views Right  Result Date: 11/27/2021 CLINICAL DATA:  Several days of right knee pain EXAM: RIGHT KNEE - COMPLETE 4+ VIEW COMPARISON:  None Available. FINDINGS: No evidence of fracture or dislocation. Small knee joint effusion. No evidence of arthropathy or other focal bone abnormality. Soft tissues are unremarkable. IMPRESSION: No acute osseous abnormality. Electronically Signed   By: Placido Sou M.D.   On: 11/27/2021 12:24     LOS: 1 day   Antonieta Pert, MD [facility]  11/28/2021, 8:23 AM

## 2021-11-28 NOTE — H&P (View-Only) (Signed)
  Subjective: Patient reports pain as marked. Feels no different than yesterday. Denies fever but endorses chills. Some nausea earlier but better now. Tolerating diet.  Urinating. No CP, SOB. Has not worked with PT/OT on mobilizing OOB but has no WB restrictions currently so able to WBAT RLE.   Reports last used heroin 11/26/21. His girlfriend was found down on the floor of his room this morning and rapid response had to be called. She was transported to the ED. Unknown if she or he used any outside drugs while in the room.   CT right knee shows severe prepatellar soft tissue edema with small bubbles of air most concerning for severe cellulitis. Prepatellar ill-defined fluid like collection concerning for a phlegmon or developing abscess.  Objective:   VITALS:   Vitals:   11/27/21 2133 11/28/21 0145 11/28/21 0516 11/28/21 1054  BP: 130/76 (!) 120/54 124/67 132/76  Pulse: 89 93 91 95  Resp: 17 17 16 16  Temp: 98.3 F (36.8 C) 97.8 F (36.6 C) 98.3 F (36.8 C) 98.4 F (36.9 C)  TempSrc:   Oral Oral  SpO2: 100% 100% 100% 97%  Weight:      Height:          Latest Ref Rng & Units 11/27/2021    9:59 AM  CBC  WBC 4.0 - 10.5 K/uL 12.2   Hemoglobin 13.0 - 17.0 g/dL 12.5   Hematocrit 39.0 - 52.0 % 37.4   Platelets 150 - 400 K/uL 224       Latest Ref Rng & Units 11/27/2021    9:59 AM  BMP  Glucose 70 - 99 mg/dL 132   BUN 6 - 20 mg/dL 13   Creatinine 0.61 - 1.24 mg/dL 0.56   Sodium 135 - 145 mmol/L 132   Potassium 3.5 - 5.1 mmol/L 3.5   Chloride 98 - 111 mmol/L 97   CO2 22 - 32 mmol/L 25   Calcium 8.9 - 10.3 mg/dL 9.0    Intake/Output      07/28 0701 07/29 0700 07/29 0701 07/30 0700   P.O.  180   Total Intake(mL/kg)  180 (2.1)   Net  +180           Physical Exam: General: NAD.  Sitting up in bed, calm, ill-appearing, disheveled  Resp: No increased wob Cardio: regular rate and rhythm ABD soft Neurologically intact MSK TTP right anterior and medial knee Right  thigh and calf non-TTP NVI Knee ROM limited but intact Dorsiflexion/Plantar flexion intact Skin of RLE warm to the touch Erythema beginning to spread proximal up medial right thigh beyond the outlined area towards groin  Open sore on anterior aspect of knee without current discharge Some areas of fluctuance around open sore and on anteromedial tibia  See images below  Media From this encounter Scan on 11/28/2021 12:33 PM by Yuleni Burich M, PA-C  Scan on 11/28/2021 12:32 PM by Lella Mullany M, PA-C  Scan on 11/28/2021 12:31 PM by Nisha Dhami M, PA-C  Scan on 11/28/2021 12:31 PM by Shanece Cochrane M, PA-C                     Assessment:    Principal Problem:   Cellulitis of right knee Active Problems:   Abnormal LFTs   Normocytic anemia   Hyponatremia   Sepsis ([facility])    Plan: Erythema spreading up thigh with areas of obvious fluctuance indicating worsening infection despite ABX. After a lengthy discussion with the   patient about the risks and benefits of non-operative vs operative treatment, he has agreed to proceed with surgery. Will plan to take the patient to the OR Sunday morning for I&D right knee. May need to place a wound vac.   Increase Tylenol to 1000mg  q6h Continue to restrict visitors  Continue ABX for cellulitis Incentive Spirometry Elevate and Apply ice PRN  Weightbearing: WBAT RLE Insicional and dressing care: continue to monitor for drainage and changes in redness Orthopedic device(s): None Showering: Keep dressing dry VTE prophylaxis: Lovenox 40mg  qd  while in patient , SCDs, ambulation IV ABX: Cefepime and Vanc Pain control: Toradol q8h, Oxy PRN; minimize narcotics as much as possible as patient is active IV drug user Follow - up plan:  TBD Contact information:  MD, PA-C  Dispo:  TBD     Ramond Marrow, [facility] [phone] 11/28/2021, 11:26 AM

## 2021-11-28 NOTE — Progress Notes (Signed)
Pt on  tele-monitoring for safety observation. Educated pt on  need for monitoring, pt verbalized understanding and agreeable for it.

## 2021-11-29 ENCOUNTER — Encounter (HOSPITAL_COMMUNITY): Payer: Self-pay | Admitting: Anesthesiology

## 2021-11-29 ENCOUNTER — Other Ambulatory Visit: Payer: Self-pay

## 2021-11-29 ENCOUNTER — Inpatient Hospital Stay (HOSPITAL_COMMUNITY): Payer: 59 | Admitting: Anesthesiology

## 2021-11-29 ENCOUNTER — Encounter (HOSPITAL_COMMUNITY)
Admission: EM | Discharge status: Against Medical Advice | Payer: Self-pay | Source: Home / Self Care | Attending: Internal Medicine

## 2021-11-29 DIAGNOSIS — L03115 Cellulitis of right lower limb: Secondary | ICD-10-CM | POA: Diagnosis not present

## 2021-11-29 DIAGNOSIS — A419 Sepsis, unspecified organism: Secondary | ICD-10-CM

## 2021-11-29 DIAGNOSIS — R7989 Other specified abnormal findings of blood chemistry: Secondary | ICD-10-CM

## 2021-11-29 DIAGNOSIS — M7041 Prepatellar bursitis, right knee: Secondary | ICD-10-CM

## 2021-11-29 DIAGNOSIS — E871 Hypo-osmolality and hyponatremia: Secondary | ICD-10-CM

## 2021-11-29 DIAGNOSIS — L02415 Cutaneous abscess of right lower limb: Secondary | ICD-10-CM

## 2021-11-29 HISTORY — PX: I & D EXTREMITY: SHX5045

## 2021-11-29 LAB — COMPREHENSIVE METABOLIC PANEL
ALT: 40 U/L (ref 0–44)
AST: 32 U/L (ref 15–41)
Albumin: 2.7 g/dL — ABNORMAL LOW (ref 3.5–5.0)
Alkaline Phosphatase: 157 U/L — ABNORMAL HIGH (ref 38–126)
Anion gap: 8 (ref 5–15)
BUN: 14 mg/dL (ref 6–20)
CO2: 27 mmol/L (ref 22–32)
Calcium: 8.8 mg/dL — ABNORMAL LOW (ref 8.9–10.3)
Chloride: 105 mmol/L (ref 98–111)
Creatinine, Ser: 0.62 mg/dL (ref 0.61–1.24)
GFR, Estimated: >60 mL/min (ref >60)
Glucose, Bld: 114 mg/dL — ABNORMAL HIGH (ref 70–99)
Potassium: 3.4 mmol/L — ABNORMAL LOW (ref 3.5–5.1)
Sodium: 140 mmol/L (ref 135–145)
Total Bilirubin: 0.4 mg/dL (ref 0.3–1.2)
Total Protein: 6.5 g/dL (ref 6.5–8.1)

## 2021-11-29 LAB — CBC
HCT: 29.9 % — ABNORMAL LOW (ref 39.0–52.0)
Hemoglobin: 9.6 g/dL — ABNORMAL LOW (ref 13.0–17.0)
MCH: 26.9 pg (ref 26.0–34.0)
MCHC: 32.1 g/dL (ref 30.0–36.0)
MCV: 83.8 fL (ref 80.0–100.0)
Platelets: 209 10*3/uL (ref 150–400)
RBC: 3.57 MIL/uL — ABNORMAL LOW (ref 4.22–5.81)
RDW: 13.4 % (ref 11.5–15.5)
WBC: 6.2 10*3/uL (ref 4.0–10.5)
nRBC: 0 % (ref 0.0–0.2)

## 2021-11-29 LAB — CULTURE, BLOOD (ROUTINE X 2): Culture: NO GROWTH

## 2021-11-29 SURGERY — IRRIGATION AND DEBRIDEMENT EXTREMITY
Anesthesia: General | Site: Knee | Laterality: Right

## 2021-11-29 MED ORDER — HYDROMORPHONE HCL 1 MG/ML IJ SOLN
0.2500 mg | INTRAMUSCULAR | Status: DC | PRN
  Administered 2021-11-29 (×3): 0.5 mg via INTRAVENOUS

## 2021-11-29 MED ORDER — PROPOFOL 10 MG/ML IV BOLUS
INTRAVENOUS | Status: AC
Start: 2021-11-29 — End: ?
  Filled 2021-11-29: qty 20

## 2021-11-29 MED ORDER — HYDROMORPHONE HCL 1 MG/ML IJ SOLN
INTRAMUSCULAR | Status: DC | PRN
  Administered 2021-11-29 (×2): 1 mg via INTRAVENOUS

## 2021-11-29 MED ORDER — ACETAMINOPHEN 160 MG/5ML PO SOLN: 325.0000 mg | ORAL | Status: DC | PRN

## 2021-11-29 MED ORDER — DIPHENHYDRAMINE HCL 12.5 MG/5ML PO ELIX
12.5000 mg | ORAL_SOLUTION | ORAL | Status: DC | PRN
  Administered 2021-11-29 – 2021-11-30 (×2): 12.5 mg via ORAL
  Filled 2021-11-29 (×2): qty 5

## 2021-11-29 MED ORDER — ONDANSETRON HCL 4 MG PO TABS: 4.0000 mg | ORAL_TABLET | Freq: Four times a day (QID) | ORAL | Status: DC | PRN

## 2021-11-29 MED ORDER — SODIUM CHLORIDE 0.9 % IR SOLN
Status: DC | PRN
  Administered 2021-11-29: 3000 mL

## 2021-11-29 MED ORDER — KETOROLAC TROMETHAMINE 30 MG/ML IJ SOLN
INTRAMUSCULAR | Status: AC
  Filled 2021-11-29: qty 1

## 2021-11-29 MED ORDER — DEXAMETHASONE SODIUM PHOSPHATE 10 MG/ML IJ SOLN: 8.0000 mg | Freq: Once | INTRAMUSCULAR | Status: DC

## 2021-11-29 MED ORDER — OXYCODONE HCL 5 MG PO TABS
5.0000 mg | ORAL_TABLET | ORAL | Status: DC | PRN
  Administered 2021-11-29 – 2021-11-30 (×2): 5 mg via ORAL
  Filled 2021-11-29 (×2): qty 1

## 2021-11-29 MED ORDER — HYDROCODONE-ACETAMINOPHEN 5-325 MG PO TABS: 1.0000 | ORAL_TABLET | ORAL | Status: DC | PRN

## 2021-11-29 MED ORDER — ONDANSETRON HCL 4 MG/2ML IJ SOLN
INTRAMUSCULAR | Status: AC
  Filled 2021-11-29: qty 2

## 2021-11-29 MED ORDER — ONDANSETRON HCL 4 MG/2ML IJ SOLN: 4.0000 mg | Freq: Four times a day (QID) | INTRAMUSCULAR | Status: DC | PRN

## 2021-11-29 MED ORDER — METOCLOPRAMIDE HCL 5 MG PO TABS: 5.0000 mg | ORAL_TABLET | Freq: Three times a day (TID) | ORAL | Status: DC | PRN

## 2021-11-29 MED ORDER — METOCLOPRAMIDE HCL 5 MG/ML IJ SOLN: 5.0000 mg | Freq: Three times a day (TID) | INTRAMUSCULAR | Status: DC | PRN

## 2021-11-29 MED ORDER — FENTANYL CITRATE (PF) 100 MCG/2ML IJ SOLN
INTRAMUSCULAR | Status: DC | PRN
  Administered 2021-11-29: 100 ug via INTRAVENOUS

## 2021-11-29 MED ORDER — DEXAMETHASONE SODIUM PHOSPHATE 10 MG/ML IJ SOLN
INTRAMUSCULAR | Status: AC
  Filled 2021-11-29: qty 1

## 2021-11-29 MED ORDER — BUPIVACAINE HCL (PF) 0.5 % IJ SOLN
INTRAMUSCULAR | Status: AC
  Filled 2021-11-29: qty 30

## 2021-11-29 MED ORDER — METHOCARBAMOL 500 MG IVPB - SIMPLE MED: 500.0000 mg | Freq: Four times a day (QID) | INTRAVENOUS | Status: DC | PRN

## 2021-11-29 MED ORDER — ACETAMINOPHEN 10 MG/ML IV SOLN
INTRAVENOUS | Status: DC | PRN
  Administered 2021-11-29: 1000 mg via INTRAVENOUS

## 2021-11-29 MED ORDER — AMISULPRIDE (ANTIEMETIC) 5 MG/2ML IV SOLN: 10.0000 mg | Freq: Once | INTRAVENOUS | Status: DC | PRN

## 2021-11-29 MED ORDER — ACETAMINOPHEN 10 MG/ML IV SOLN: 1000.0000 mg | Freq: Once | INTRAVENOUS | Status: DC | PRN

## 2021-11-29 MED ORDER — TRAMADOL HCL 50 MG PO TABS
50.0000 mg | ORAL_TABLET | Freq: Four times a day (QID) | ORAL | Status: DC
  Administered 2021-11-29 (×3): 50 mg via ORAL
  Filled 2021-11-29 (×3): qty 1

## 2021-11-29 MED ORDER — OXYCODONE HCL 5 MG/5ML PO SOLN: 5.0000 mg | Freq: Once | ORAL | Status: DC | PRN

## 2021-11-29 MED ORDER — ACETAMINOPHEN 10 MG/ML IV SOLN
INTRAVENOUS | Status: AC
  Filled 2021-11-29: qty 100

## 2021-11-29 MED ORDER — ACETAMINOPHEN 325 MG PO TABS: 325.0000 mg | ORAL_TABLET | ORAL | Status: DC | PRN

## 2021-11-29 MED ORDER — DEXAMETHASONE SODIUM PHOSPHATE 10 MG/ML IJ SOLN
INTRAMUSCULAR | Status: DC | PRN
  Administered 2021-11-29: 10 mg via INTRAVENOUS

## 2021-11-29 MED ORDER — OXYCODONE HCL 5 MG PO TABS: 5.0000 mg | ORAL_TABLET | Freq: Once | ORAL | Status: DC | PRN

## 2021-11-29 MED ORDER — LIDOCAINE HCL (PF) 2 % IJ SOLN
INTRAMUSCULAR | Status: AC
  Filled 2021-11-29: qty 5

## 2021-11-29 MED ORDER — LIDOCAINE 2% (20 MG/ML) 5 ML SYRINGE
INTRAMUSCULAR | Status: DC | PRN
  Administered 2021-11-29: 60 mg via INTRAVENOUS

## 2021-11-29 MED ORDER — PROPOFOL 10 MG/ML IV BOLUS
INTRAVENOUS | Status: DC | PRN
  Administered 2021-11-29: 200 mg via INTRAVENOUS

## 2021-11-29 MED ORDER — POLYETHYLENE GLYCOL 3350 17 G PO PACK: 17.0000 g | PACK | Freq: Every day | ORAL | Status: DC | PRN

## 2021-11-29 MED ORDER — POVIDONE-IODINE 10 % EX SWAB: 2.0000 | Freq: Once | CUTANEOUS | Status: DC

## 2021-11-29 MED ORDER — ONDANSETRON HCL 4 MG/2ML IJ SOLN
INTRAMUSCULAR | Status: DC | PRN
  Administered 2021-11-29: 4 mg via INTRAVENOUS

## 2021-11-29 MED ORDER — MIDAZOLAM HCL 5 MG/5ML IJ SOLN
INTRAMUSCULAR | Status: DC | PRN
  Administered 2021-11-29: 2 mg via INTRAVENOUS

## 2021-11-29 MED ORDER — HYDROMORPHONE HCL 2 MG/ML IJ SOLN
INTRAMUSCULAR | Status: AC
  Filled 2021-11-29: qty 1

## 2021-11-29 MED ORDER — LACTATED RINGERS IV SOLN: INTRAVENOUS | Status: DC | PRN

## 2021-11-29 MED ORDER — HYDROMORPHONE HCL 1 MG/ML IJ SOLN
INTRAMUSCULAR | Status: AC
  Filled 2021-11-29: qty 2

## 2021-11-29 MED ORDER — BISACODYL 10 MG RE SUPP: 10.0000 mg | Freq: Every day | RECTAL | Status: DC | PRN

## 2021-11-29 MED ORDER — BUPIVACAINE HCL (PF) 0.5 % IJ SOLN
INTRAMUSCULAR | Status: DC | PRN
  Administered 2021-11-29: 10 mL

## 2021-11-29 MED ORDER — FENTANYL CITRATE (PF) 100 MCG/2ML IJ SOLN
INTRAMUSCULAR | Status: AC
  Filled 2021-11-29: qty 2

## 2021-11-29 MED ORDER — DOCUSATE SODIUM 100 MG PO CAPS
100.0000 mg | ORAL_CAPSULE | Freq: Two times a day (BID) | ORAL | Status: DC
  Administered 2021-11-29: 100 mg via ORAL
  Filled 2021-11-29: qty 1

## 2021-11-29 MED ORDER — VANCOMYCIN HCL 1000 MG IV SOLR
INTRAVENOUS | Status: AC
Start: 2021-11-29 — End: ?
  Filled 2021-11-29: qty 20

## 2021-11-29 MED ORDER — METHOCARBAMOL 500 MG PO TABS
500.0000 mg | ORAL_TABLET | Freq: Four times a day (QID) | ORAL | Status: DC | PRN
  Administered 2021-11-29: 500 mg via ORAL
  Filled 2021-11-29: qty 1

## 2021-11-29 MED ORDER — PROMETHAZINE HCL 25 MG/ML IJ SOLN: 6.2500 mg | INTRAMUSCULAR | Status: DC | PRN

## 2021-11-29 MED ORDER — PANTOPRAZOLE SODIUM 40 MG PO TBEC
40.0000 mg | DELAYED_RELEASE_TABLET | Freq: Every day | ORAL | Status: DC
  Administered 2021-11-29: 40 mg via ORAL
  Filled 2021-11-29: qty 1

## 2021-11-29 MED ORDER — DEXMEDETOMIDINE (PRECEDEX) IN NS 20 MCG/5ML (4 MCG/ML) IV SYRINGE
PREFILLED_SYRINGE | INTRAVENOUS | Status: DC | PRN
  Administered 2021-11-29 (×2): 8 ug via INTRAVENOUS
  Administered 2021-11-29: 12 ug via INTRAVENOUS

## 2021-11-29 SURGICAL SUPPLY — 49 items
BAG COUNTER SPONGE SURGICOUNT (BAG) ×2 IMPLANT
BAG SPNG CNTER NS LX DISP (BAG) ×1
BNDG ELASTIC 4X5.8 VLCR STR LF (GAUZE/BANDAGES/DRESSINGS) ×2 IMPLANT
BNDG ELASTIC 6X5.8 VLCR STR LF (GAUZE/BANDAGES/DRESSINGS) ×2 IMPLANT
BNDG GAUZE DERMACEA FLUFF (GAUZE/BANDAGES/DRESSINGS) ×1
BNDG GAUZE DERMACEA FLUFF 4 (GAUZE/BANDAGES/DRESSINGS) ×1 IMPLANT
BNDG GZE DERMACEA 4 6PLY (GAUZE/BANDAGES/DRESSINGS) ×1
CNTNR URN SCR LID CUP LEK RST (MISCELLANEOUS) IMPLANT
CONT SPEC 4OZ STRL OR WHT (MISCELLANEOUS)
COVER SURGICAL LIGHT HANDLE (MISCELLANEOUS) ×2 IMPLANT
CUFF TOURN SGL QUICK 24 (TOURNIQUET CUFF)
CUFF TOURN SGL QUICK 34 (TOURNIQUET CUFF)
CUFF TRNQT CYL 24X4X16.5-23 (TOURNIQUET CUFF) IMPLANT
CUFF TRNQT CYL 34X4.125X (TOURNIQUET CUFF) IMPLANT
DRAPE U-SHAPE 47X51 STRL (DRAPES) IMPLANT
DRSG PAD ABDOMINAL 8X10 ST (GAUZE/BANDAGES/DRESSINGS) ×2 IMPLANT
DURAPREP 26ML APPLICATOR (WOUND CARE) ×2 IMPLANT
ELECT REM PT RETURN 15FT ADLT (MISCELLANEOUS) IMPLANT
GAUZE PACKING IODOFORM 1X5 (PACKING) ×1 IMPLANT
GAUZE SPONGE 4X4 12PLY STRL (GAUZE/BANDAGES/DRESSINGS) ×2 IMPLANT
GAUZE XEROFORM 1X8 LF (GAUZE/BANDAGES/DRESSINGS) ×2 IMPLANT
GLOVE BIO SURGEON STRL SZ 6.5 (GLOVE) ×2 IMPLANT
GLOVE BIOGEL PI IND STRL 8 (GLOVE) ×2 IMPLANT
GLOVE BIOGEL PI INDICATOR 8 (GLOVE) ×2
GLOVE INDICATOR 6.5 STRL GRN (GLOVE) ×4 IMPLANT
GLOVE SURG POLYISO LF SZ8 (GLOVE) ×4 IMPLANT
GOWN STRL REUS W/ TWL LRG LVL3 (GOWN DISPOSABLE) ×2 IMPLANT
GOWN STRL REUS W/ TWL XL LVL3 (GOWN DISPOSABLE) ×1 IMPLANT
GOWN STRL REUS W/TWL LRG LVL3 (GOWN DISPOSABLE) ×4
GOWN STRL REUS W/TWL XL LVL3 (GOWN DISPOSABLE) ×2
HANDPIECE INTERPULSE COAX TIP (DISPOSABLE)
KIT BASIN OR (CUSTOM PROCEDURE TRAY) ×2 IMPLANT
KIT TURNOVER KIT A (KITS) IMPLANT
MANIFOLD NEPTUNE II (INSTRUMENTS) ×2 IMPLANT
NS IRRIG 1000ML POUR BTL (IV SOLUTION) ×2 IMPLANT
PACK ORTHO EXTREMITY (CUSTOM PROCEDURE TRAY) ×2 IMPLANT
PAD ARMBOARD 7.5X6 YLW CONV (MISCELLANEOUS) ×4 IMPLANT
PAD CAST 4YDX4 CTTN HI CHSV (CAST SUPPLIES) ×1 IMPLANT
PADDING CAST COTTON 4X4 STRL (CAST SUPPLIES) ×2
PADDING CAST COTTON 6X4 STRL (CAST SUPPLIES) ×2 IMPLANT
SET HNDPC FAN SPRY TIP SCT (DISPOSABLE) IMPLANT
STOCKINETTE 8 INCH (MISCELLANEOUS) ×1 IMPLANT
SUT ETHILON 2 0 FS 18 (SUTURE) ×2 IMPLANT
SWAB CULTURE ESWAB REG 1ML (MISCELLANEOUS) IMPLANT
TOWEL OR 17X26 10 PK STRL BLUE (TOWEL DISPOSABLE) ×2 IMPLANT
TOWEL OR NON WOVEN STRL DISP B (DISPOSABLE) ×2 IMPLANT
TUBING CONNECTING 10 (TUBING) ×2 IMPLANT
UNDERPAD 30X36 HEAVY ABSORB (UNDERPADS AND DIAPERS) ×2 IMPLANT
YANKAUER SUCT BULB TIP NO VENT (SUCTIONS) ×2 IMPLANT

## 2021-11-29 NOTE — Op Note (Signed)
Orthopaedic Surgery Operative Note (CSN: [id number])  John Cabrera  [birth year] Date of Surgery: 11/27/2021 - 11/29/2021   Diagnoses:  RIGHT KNEE ABSCESS and infected prepatellar bursa  Procedure: Right knee incision and debridement of 6 x 8 cm abscess Right knee prepatellar bursectomy Debridement of skin with complex wound closure   Operative Finding Successful completion of the planned procedure.  Patient had gross purulence and a large amount of unhealthy appearing tissue.  Bursectomy was performed as well as a debridement of the skin.  We left about 2 feet of iodoform 1 inch packing in place.  2 to 3 inches removed each day.  Antibiotics per medicine and can discharge at their discretion.  Cultures obtained.  Sutures to be removed at 3-4 weeks.  Follow-up at that point.   Post-operative plan: The patient will be weightbearing to tolerance with packing removed daily as above.  The patient will be readmitted to medicine.  DVT prophylaxis Aspirin 81 mg twice daily for 6 weeks.   No narcotic pain medicines indicated outpatient.  Over-the-counter medicines are appropriate.  Follow up plan will be scheduled in approximately 3-4 weeks for incision check.  Post-Op Diagnosis: Same Surgeons:Primary: Bjorn Pippin, MD Assistants: None Location: [facility] ROOM 01 Anesthesia: General with local anesthesia Antibiotics: Ancef 2 g with local vancomycin powder 1 g at the surgical site Tourniquet time: None Estimated Blood Loss: Minimal Complications: None Specimens: 2 for culture, right knee #1 and 2 Implants: * No implants in log *  Indications for Surgery:   John Cabrera is a 51 y.o. male with with abscess in anterior portion of right knee and prepatellar bursa.  It did improve somewhat but there is still fluctuance in the anterior knee.Marland Kitchen  Benefits and risks of operative and nonoperative management were discussed prior to surgery with patient/guardian(s) and informed consent form was completed.   Specific risks including infection, need for additional surgery, continued infection, need for additional surgery amongst others.  We also discussed septic arthritis but he had no signs of infection within his knee when evaluated on CT scan.   Procedure:   The patient was identified properly. Informed consent was obtained and the surgical site was marked. The patient was taken up to suite where general anesthesia was induced.  The patient was positioned on a regular bed.  The right knee was prepped and draped in the usual sterile fashion.  Timeout was performed before the beginning of the case.  We began with a 4 cm incision over the anterior knee over the area of purulent drainage.  We incised skin sharply to be hemostasis we progressed.  We identified the prepatellar bursa which was resected.  We found that the abscess tracked and was about 6 x 8 cm in size.  It tracked up the medial aspect of the prepatellar space.  We found no obvious deep involvement past the fascia.  We irrigated with 3 L of normal saline with a pulse irrigator and debridement again.  Multiple specimens were taken for culture x2.  Local vancomycin powder placed this was local anesthetic.  We put 2 simple 2-0 nylon sutures in place to perform a loose closure.  We packed with iodoform gauze about 2 feet.  Sterile dressing was placed.  Debridement type: Excisional Debridement  Side: right  Body Location: Knee  Tools used for debridement: scalpel and rongeur  Pre-debridement Wound size (cm):   Length: 6        width: 8     depth: 3  Post-debridement Wound size (cm):   Length: 6        width: 8     depth: 3  Debridement depth beyond dead/damaged tissue down to healthy viable tissue: yes  Tissue layer involved: skin, subcutaneous tissue, muscle / fascia  Nature of tissue removed: Necrotic, Non-viable tissue, and Purulence  Irrigation volume: 3 L     Irrigation fluid type: Normal Saline    Patient was awoken taken  to PACU in stable condition.

## 2021-11-29 NOTE — Progress Notes (Signed)
PROGRESS NOTE    John Cabrera  WNI:[id number] DOB: 1971/01/11 DOA: 11/27/2021 PCP: Pcp, No   Brief Narrative:  51 year old male with history of left inguinal hernia status beriberi, tobacco use, heroin use, alcohol use presented with right knee pain, redness, swelling for more than a week after possible small foreign body in the knee at work and developed a small abscess.  On presentation, he was found to have leukocytosis with CRP of 18.9.  CT of the right knee showed severe prepatellar soft tissue edema with small bubbles of air most concerning for severe cellulitis with possible prepatellar phlegmon or developing abscess.  He was started on broad-spectrum antibiotics.  Orthopedics was consulted.  Assessment & Plan:   Sepsis: Present on admission Right knee cellulitis with possible prepatellar abscess Leukocytosis: Resolved -Currently on cefepime and vancomycin.  Orthopedics following and planning for surgical intervention today. -Blood cultures negative so far. -Continue pain management.  Minimize narcotics  History of intravenous drug abuse -Strongly counseled regarding cessation by prior hospitalist.  Continue Tele[employer].  Urine drug screen was positive for benzodiazepines.  Elevated LFTs -Resolved  Hep C antibody positive -Possibly has chronic hep C.  Will need outpatient evaluation and follow-up by PCP  Hyponatremia -Resolved  Hypokalemia -Repeat a.m. labs  Normocytic anemia -Questionable cause.  Hemoglobin stable.  No signs of bleeding.  Monitor intermittently.    DVT prophylaxis: Lovenox Code Status: Full Family Communication: None at bedside Disposition Plan: Status is: Inpatient Remains inpatient appropriate because: Of severity of illness.  Plan for possible surgical intervention by orthopedics    Consultants: General surgery  Procedures: None  Antimicrobials: Cefepime and vancomycin from 11/27/2021 onwards   Subjective: Patient seen and examined at  bedside.  Still complains of right knee pain.  No overnight fever, vomiting, chest pain reported.  Objective: Vitals:   11/29/21 1000 11/29/21 1001 11/29/21 1005 11/29/21 1015  BP:  136/81  132/87  Pulse: 77 82 76 75  Resp: (!) 7 12 14 12   Temp: 99.3 F (37.4 C)     TempSrc:      SpO2: 100% 100% 100% 100%  Weight:      Height:        Intake/Output Summary (Last 24 hours) at 11/29/2021 1019 Last data filed at 11/29/2021 1000 Gross per 24 hour  Intake 2530 ml  Output 1980 ml  Net 550 ml   Filed Weights   11/27/21 0944  Weight: 86.2 kg    Examination:  General exam: Appears calm and comfortable.  No distress.  Currently on room air. Respiratory system: Bilateral decreased breath sounds at bases, no wheezing Cardiovascular system: S1 & S2 heard, Rate controlled Gastrointestinal system: Abdomen is nondistended, soft and nontender. Normal bowel sounds heard. Extremities: No cyanosis, clubbing; trace lower extremity edema present.  Right knee wrapped. Central nervous system: Alert and oriented. No focal neurological deficits. Moving extremities Skin: No obvious ecchymosis/other lesions  psychiatry: Flat affect.  No signs of agitation.    Data Reviewed: I have personally reviewed following labs and imaging studies  CBC: Recent Labs  Lab 11/27/21 0959 11/29/21 0328  WBC 12.2* 6.2  NEUTROABS 10.4*  --   HGB 12.5* 9.6*  HCT 37.4* 29.9*  MCV 81.1 83.8  PLT 224 209   Basic Metabolic Panel: Recent Labs  Lab 11/27/21 0959 11/29/21 0328  NA 132* 140  K 3.5 3.4*  CL 97* 105  CO2 25 27  GLUCOSE 132* 114*  BUN 13 14  CREATININE 0.56* 0.62  CALCIUM  9.0 8.8*   GFR: Estimated Creatinine Clearance: 115 mL/min (by C-G formula based on SCr of 0.62 mg/dL). Liver Function Tests: Recent Labs  Lab 11/27/21 0959 11/29/21 0328  AST 77* 32  ALT 52* 40  ALKPHOS 177* 157*  BILITOT 0.6 0.4  PROT 7.6 6.5  ALBUMIN 3.4* 2.7*   No results for input(s): "LIPASE", "AMYLASE" in  the last 168 hours. No results for input(s): "AMMONIA" in the last 168 hours. Coagulation Profile: Recent Labs  Lab 11/27/21 0959  INR 1.1   Cardiac Enzymes: No results for input(s): "CKTOTAL", "CKMB", "CKMBINDEX", "TROPONINI" in the last 168 hours. BNP (last 3 results) No results for input(s): "PROBNP" in the last 8760 hours. HbA1C: No results for input(s): "HGBA1C" in the last 72 hours. CBG: No results for input(s): "GLUCAP" in the last 168 hours. Lipid Profile: No results for input(s): "CHOL", "HDL", "LDLCALC", "TRIG", "CHOLHDL", "LDLDIRECT" in the last 72 hours. Thyroid Function Tests: No results for input(s): "TSH", "T4TOTAL", "FREET4", "T3FREE", "THYROIDAB" in the last 72 hours. Anemia Panel: No results for input(s): "VITAMINB12", "FOLATE", "FERRITIN", "TIBC", "IRON", "RETICCTPCT" in the last 72 hours. Sepsis Labs: Recent Labs  Lab 11/27/21 1000  LATICACIDVEN 0.9    Recent Results (from the past 240 hour(s))  Culture, blood (Routine x 2)     Status: None (Preliminary result)   Collection Time: 11/27/21 10:14 AM   Specimen: BLOOD  Result Value Ref Range Status   Specimen Description   Final    BLOOD LEFT WRIST Performed at [facility], [address], [place], [place] [address]    Special Requests   Final    BOTTLES DRAWN AEROBIC AND ANAEROBIC Blood Culture results may not be optimal due to an excessive volume of blood received in culture bottles Performed at [facility], [address], [place], [place] [address]    Culture   Final    NO GROWTH 2 DAYS Performed at [facility], [address] [place], [place] [address]    Report Status PENDING  Incomplete  Culture, blood (Routine x 2)     Status: None (Preliminary result)   Collection Time: 11/27/21 10:17 AM   Specimen: BLOOD LEFT HAND  Result Value Ref Range Status   Specimen Description   Final    BLOOD LEFT HAND Performed at [facility],  [address], [place], [place] [address]    Special Requests   Final    BOTTLES DRAWN AEROBIC AND ANAEROBIC Blood Culture results may not be optimal due to an excessive volume of blood received in culture bottles Performed at [facility], [address] [place], [place] [address]    Culture   Final    NO GROWTH 2 DAYS Performed at [facility], [address] [place], [place] [address]    Report Status PENDING  Incomplete  MRSA Next Gen by PCR, Nasal     Status: None   Collection Time: 11/28/21  6:42 PM   Specimen: Nasal Mucosa; Nasal Swab  Result Value Ref Range Status   MRSA by PCR Next Gen NOT DETECTED NOT DETECTED Final    Comment: (NOTE) The GeneXpert MRSA Assay (FDA approved for NASAL specimens only), is one component of a comprehensive MRSA colonization surveillance program. It is not intended to diagnose MRSA infection nor to guide or monitor treatment for MRSA infections. Test performance is not FDA approved in patients less than 23 years old. Performed at [facility], [address] [place], [place] [address]          Radiology Studies: CT KNEE RIGHT WO CONTRAST  Result Date: 11/27/2021 CLINICAL DATA:  Septic arthritis, right knee effusion EXAM: CT OF THE RIGHT KNEE WITHOUT CONTRAST TECHNIQUE: Multidetector CT imaging of the right knee was performed according to the standard protocol. Multiplanar CT image reconstructions were also generated. RADIATION DOSE REDUCTION: This exam was performed according to the departmental dose-optimization program which includes automated exposure control, adjustment of the mA and/or kV according to patient size and/or use of iterative reconstruction technique. COMPARISON:  None Available. FINDINGS: Bones/Joint/Cartilage No fracture or dislocation. Normal alignment. No joint effusion. 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 Ligaments Ligaments  are suboptimally evaluated by CT. Muscles and Tendons Muscles are normal. No muscle atrophy. No intramuscular fluid collection or hematoma. Quadriceps tendon and patellar tendon are intact. Soft tissue Severe prepatellar soft tissue edema with small bubbles of air most concerning for severe cellulitis. Prepatellar ill-defined fluid like collection concerning for a phlegmon or developing abscess. No soft tissue mass. IMPRESSION: 1. Severe prepatellar soft tissue edema with small bubbles of air most concerning for severe cellulitis. Prepatellar ill-defined fluid like collection concerning for a phlegmon or developing abscess. 2. 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 3. No evidence of osteomyelitis or septic arthritis. Electronically Signed   By: Elige Ko M.D.   On: 11/27/2021 12:46   DG Knee Complete 4 Views Right  Result Date: 11/27/2021 CLINICAL DATA:  Several days of right knee pain EXAM: RIGHT KNEE - COMPLETE 4+ VIEW COMPARISON:  None Available. FINDINGS: No evidence of fracture or dislocation. Small knee joint effusion. No evidence of arthropathy or other focal bone abnormality. Soft tissues are unremarkable. IMPRESSION: No acute osseous abnormality. Electronically Signed   By: Minerva Fester M.D.   On: 11/27/2021 12:24        Scheduled Meds:  [MAR Hold] acetaminophen  1,000 mg Oral Q6H   [MAR Hold] enoxaparin (LOVENOX) injection  40 mg Subcutaneous Q24H   HYDROmorphone       Continuous Infusions:  acetaminophen     [MAR Hold] ceFEPime (MAXIPIME) IV 2 g (11/29/21 0045)   [MAR Hold] vancomycin 1,500 mg (11/29/21 0332)          Glade Lloyd, MD [facility] 11/29/2021, 10:19 AM

## 2021-11-29 NOTE — Interval H&P Note (Signed)
All questions answered

## 2021-11-29 NOTE — Transfer of Care (Signed)
Immediate Anesthesia Transfer of Care Note  Patient: John Cabrera  Procedure(s) Performed: IRRIGATION AND DEBRIDEMENT EXTREMITY (Right: Knee)  Patient Location: PACU  Anesthesia Type:General  Level of Consciousness: awake and alert   Airway & Oxygen Therapy: Patient Spontanous Breathing and Patient connected to face mask oxygen  Post-op Assessment: Report given to RN and Post -op Vital signs reviewed and stable  Post vital signs: Reviewed and stable  Last Vitals:  Vitals Value Taken Time  BP    Temp    Pulse 82 11/29/21 1000  Resp 12 11/29/21 1000  SpO2 100 % 11/29/21 1000  Vitals shown include unvalidated device data.  Last Pain:  Vitals:   11/28/21 2045  TempSrc:   PainSc: 5       Patients Stated Pain Goal: 2 (11/28/21 2045)  Complications: No notable events documented.

## 2021-11-29 NOTE — Plan of Care (Signed)
?  Problem: Clinical Measurements: ?Goal: Ability to avoid or minimize complications of infection will improve ?Outcome: Progressing ?  ?Problem: Skin Integrity: ?Goal: Skin integrity will improve ?Outcome: Progressing ?  ?

## 2021-11-29 NOTE — Plan of Care (Signed)
  Problem: Skin Integrity: Goal: Skin integrity will improve Outcome: Progressing   Problem: Education: Goal: Knowledge of General Education information will improve Description: Including pain rating scale, medication(s)/side effects and non-pharmacologic comfort measures Outcome: Progressing   Problem: Clinical Measurements: Goal: Ability to avoid or minimize complications of infection will improve Outcome: Progressing   Problem: Clinical Measurements: Goal: Will remain free from infection Outcome: Progressing   Problem: Activity: Goal: Risk for activity intolerance will decrease Outcome: Progressing   Problem: Coping: Goal: Level of anxiety will decrease Outcome: Progressing   Problem: Pain Managment: Goal: General experience of comfort will improve Outcome: Progressing

## 2021-11-29 NOTE — Anesthesia Procedure Notes (Signed)
Procedure Name: LMA Insertion Date/Time: 11/29/2021 9:22 AM  Performed by: Florene Route, CRNAPatient Re-evaluated:Patient Re-evaluated prior to induction Oxygen Delivery Method: Circle system utilized Preoxygenation: Pre-oxygenation with 100% oxygen Induction Type: IV induction Ventilation: Mask ventilation without difficulty LMA: LMA inserted LMA Size: 4.0 Number of attempts: 1 Placement Confirmation: positive ETCO2 and breath sounds checked- equal and bilateral Tube secured with: Tape Dental Injury: Teeth and Oropharynx as per pre-operative assessment

## 2021-11-29 NOTE — Anesthesia Preprocedure Evaluation (Addendum)
Anesthesia Evaluation  Patient identified by MRN, date of birth, ID band Patient awake    Reviewed: Allergy & Precautions, NPO status , Patient's Chart, lab work & pertinent test results  Airway Mallampati: I  TM Distance: >3 FB Neck ROM: Full    Dental  (+) Poor Dentition, Missing, Dental Advisory Given,    Pulmonary Current Smoker,    breath sounds clear to auscultation       Cardiovascular negative cardio ROS   Rhythm:Regular Rate:Normal     Neuro/Psych negative neurological ROS  negative psych ROS   GI/Hepatic negative GI ROS, Neg liver ROS,   Endo/Other  negative endocrine ROS  Renal/GU negative Renal ROS     Musculoskeletal negative musculoskeletal ROS (+)   Abdominal Normal abdominal exam  (+)   Peds  Hematology negative hematology ROS (+)   Anesthesia Other Findings   Reproductive/Obstetrics                           Anesthesia Physical Anesthesia Plan  ASA: 2  Anesthesia Plan: General   Post-op Pain Management:    Induction: Intravenous  PONV Risk Score and Plan: 2 and Ondansetron and Midazolam  Airway Management Planned: LMA  Additional Equipment: None  Intra-op Plan:   Post-operative Plan: Extubation in OR  Informed Consent: I have reviewed the patients History and Physical, chart, labs and discussed the procedure including the risks, benefits and alternatives for the proposed anesthesia with the patient or authorized representative who has indicated his/her understanding and acceptance.     Dental advisory given  Plan Discussed with: CRNA  Anesthesia Plan Comments:        Anesthesia Quick Evaluation

## 2021-11-29 NOTE — Progress Notes (Signed)
Patient's consent was inaccurately labeled as left instead of the correct right.  We evaluated the clinic notes by [facility] and previous consents and as the patient was asleep and had obvious abscess in the anterior part of the knee we continued.

## 2021-11-29 NOTE — Anesthesia Postprocedure Evaluation (Signed)
Anesthesia Post Note  Patient: John Cabrera  Procedure(s) Performed: IRRIGATION AND DEBRIDEMENT EXTREMITY (Right: Knee)     Patient location during evaluation: PACU Anesthesia Type: General Level of consciousness: awake and alert Pain management: pain level controlled Vital Signs Assessment: post-procedure vital signs reviewed and stable Respiratory status: spontaneous breathing, nonlabored ventilation, respiratory function stable and patient connected to nasal cannula oxygen Cardiovascular status: blood pressure returned to baseline and stable Postop Assessment: no apparent nausea or vomiting Anesthetic complications: no   No notable events documented.  Last Vitals:  Vitals:   11/29/21 1045 11/29/21 1054  BP: (!) 151/88 (!) 150/62  Pulse: 81 79  Resp: 14 11  Temp:    SpO2: 99% 99%    Last Pain:  Vitals:   11/29/21 1028  TempSrc:   PainSc: 6                  Shelton Silvas

## 2021-11-30 ENCOUNTER — Encounter (HOSPITAL_COMMUNITY): Payer: Self-pay | Admitting: Orthopaedic Surgery

## 2021-11-30 LAB — CBC WITH DIFFERENTIAL/PLATELET
Abs Immature Granulocytes: 0.03 10*3/uL (ref 0.00–0.07)
Basophils Absolute: 0 10*3/uL (ref 0.0–0.1)
Basophils Relative: 0 %
Eosinophils Absolute: 0 10*3/uL (ref 0.0–0.5)
Eosinophils Relative: 1 %
HCT: 32 % — ABNORMAL LOW (ref 39.0–52.0)
Hemoglobin: 10.4 g/dL — ABNORMAL LOW (ref 13.0–17.0)
Immature Granulocytes: 0 %
Lymphocytes Relative: 22 %
Lymphs Abs: 1.8 10*3/uL (ref 0.7–4.0)
MCH: 26.8 pg (ref 26.0–34.0)
MCHC: 32.5 g/dL (ref 30.0–36.0)
MCV: 82.5 fL (ref 80.0–100.0)
Monocytes Absolute: 0.5 10*3/uL (ref 0.1–1.0)
Monocytes Relative: 6 %
Neutro Abs: 5.8 10*3/uL (ref 1.7–7.7)
Neutrophils Relative %: 71 %
Platelets: 274 10*3/uL (ref 150–400)
RBC: 3.88 MIL/uL — ABNORMAL LOW (ref 4.22–5.81)
RDW: 13.2 % (ref 11.5–15.5)
WBC: 8.1 10*3/uL (ref 4.0–10.5)
nRBC: 0 % (ref 0.0–0.2)

## 2021-11-30 LAB — BASIC METABOLIC PANEL
Anion gap: 9 (ref 5–15)
BUN: 13 mg/dL (ref 6–20)
CO2: 25 mmol/L (ref 22–32)
Calcium: 9.2 mg/dL (ref 8.9–10.3)
Chloride: 106 mmol/L (ref 98–111)
Creatinine, Ser: 0.53 mg/dL — ABNORMAL LOW (ref 0.61–1.24)
GFR, Estimated: >60 mL/min (ref >60)
Glucose, Bld: 113 mg/dL — ABNORMAL HIGH (ref 70–99)
Potassium: 3.7 mmol/L (ref 3.5–5.1)
Sodium: 140 mmol/L (ref 135–145)

## 2021-11-30 LAB — MAGNESIUM: Magnesium: 2 mg/dL (ref 1.7–2.4)

## 2021-11-30 NOTE — Progress Notes (Signed)
Pt called RN and said he is being restless, not able to sleep and is hurting. RN asked pt about pain, pt states "I am hurting all over my body. I am having withdrawal symptoms, I vomited and sweating. Pt also states he was hoping to get drugs to help with withdrawal symptoms since he recently used Iv drugs and the doctor is aware about it. Rn assessed pt, vitals stable, no tremors noted. Pain medication given and needs addressed. Later around 4:20 pt called RN and said he had taxi coming to pick him after 10 minutes and want his IV out.Marland Kitchen He need to go home make sure his wife is ok and will come back if he need to.   RN explained pt the need for him to stay and getting Iv antibiotics. Pt started taking his IV by himself. Attending notified. IV taken out by Rn. AMA form signed. Pt wheeled down in wheelchair. Pt went independently from main entrance.

## 2021-11-30 NOTE — Discharge Summary (Signed)
[facility] Discharge Summary   Patient: John Cabrera GEX:[id number]   PCP: Pcp, No DOB: 06/27/1970   Date of admission: 11/27/2021   Date of discharge: 11/30/2021    Discharge Disposition: Patient signed out AMA despite being explained the risks of doing so including worsening medical condition as well as death.   Discharge Diagnoses:  Sepsis: Present on admission Right knee cellulitis with possible prepatellar abscess Leukocytosis History of intravenous drug abuse Elevated LFTs Hep C antibody positive Hyponatremia Hypokalemia Normocytic anemia  Discharge Condition: Guarded  Hospital Course:  51 year old male with history of left inguinal hernia status beriberi, tobacco use, heroin use, alcohol use presented with right knee pain, redness, swelling for more than a week after possible small foreign body in the knee at work and developed a small abscess.  On presentation, he was found to have leukocytosis with CRP of 18.9.  CT of the right knee showed severe prepatellar soft tissue edema with small bubbles of air most concerning for severe cellulitis with possible prepatellar phlegmon or developing abscess.  He was started on broad-spectrum antibiotics.  Orthopedics was consulted. He underwent I&D of right knee abscess along with right knee prepatellar bursectomy.  He was still being continued on IV antibiotics. Patient left AMA  Procedures and Results: I&D of right knee abscess along with right knee prepatellar bursectomy   Consultations: Orthopedics  The results of significant diagnostics from this hospitalization (including imaging, microbiology, ancillary and laboratory) are listed below for reference.    Significant Diagnostic Studies: CT KNEE RIGHT WO CONTRAST  Result Date: 11/27/2021 CLINICAL DATA:  Septic arthritis, right knee effusion EXAM: CT OF THE RIGHT KNEE WITHOUT CONTRAST TECHNIQUE: Multidetector CT imaging of the right knee was performed according to the  standard protocol. Multiplanar CT image reconstructions were also generated. RADIATION DOSE REDUCTION: This exam was performed according to the departmental dose-optimization program which includes automated exposure control, adjustment of the mA and/or kV according to patient size and/or use of iterative reconstruction technique. COMPARISON:  None Available. FINDINGS: Bones/Joint/Cartilage No fracture or dislocation. Normal alignment. No joint effusion. 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 Ligaments Ligaments are suboptimally evaluated by CT. Muscles and Tendons Muscles are normal. No muscle atrophy. No intramuscular fluid collection or hematoma. Quadriceps tendon and patellar tendon are intact. Soft tissue Severe prepatellar soft tissue edema with small bubbles of air most concerning for severe cellulitis. Prepatellar ill-defined fluid like collection concerning for a phlegmon or developing abscess. No soft tissue mass. IMPRESSION: 1. Severe prepatellar soft tissue edema with small bubbles of air most concerning for severe cellulitis. Prepatellar ill-defined fluid like collection concerning for a phlegmon or developing abscess. 2. 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 3. No evidence of osteomyelitis or septic arthritis. Electronically Signed   By: Elige Ko M.D.   On: 11/27/2021 12:46   DG Knee Complete 4 Views Right  Result Date: 11/27/2021 CLINICAL DATA:  Several days of right knee pain EXAM: RIGHT KNEE - COMPLETE 4+ VIEW COMPARISON:  None Available. FINDINGS: No evidence of fracture or dislocation. Small knee joint effusion. No evidence of arthropathy or other focal bone abnormality. Soft tissues are unremarkable. IMPRESSION: No acute osseous abnormality. Electronically Signed   By: Minerva Fester M.D.   On: 11/27/2021 12:24    Microbiology: Recent Results (from the past 240 hour(s))  Culture, blood  (Routine x 2)     Status: None (Preliminary result)   Collection Time: 11/27/21 10:14 AM  Specimen: BLOOD  Result Value Ref Range Status   Specimen Description   Final    BLOOD LEFT WRIST Performed at [facility], [address], [place], [place] [address]    Special Requests   Final    BOTTLES DRAWN AEROBIC AND ANAEROBIC Blood Culture results may not be optimal due to an excessive volume of blood received in culture bottles Performed at [facility], [address] [place], [place] [address]    Culture   Final    NO GROWTH 3 DAYS Performed at [facility], [address], [place], [place] [address]    Report Status PENDING  Incomplete  Culture, blood (Routine x 2)     Status: None (Preliminary result)   Collection Time: 11/27/21 10:17 AM   Specimen: BLOOD LEFT HAND  Result Value Ref Range Status   Specimen Description   Final    BLOOD LEFT HAND Performed at [facility], [address], [place], [place] [address]    Special Requests   Final    BOTTLES DRAWN AEROBIC AND ANAEROBIC Blood Culture results may not be optimal due to an excessive volume of blood received in culture bottles Performed at [facility], [address] [place], [place] [address]    Culture   Final    NO GROWTH 3 DAYS Performed at [facility], [address], [place], [place] [address]    Report Status PENDING  Incomplete  MRSA Next Gen by PCR, Nasal     Status: None   Collection Time: 11/28/21  6:42 PM   Specimen: Nasal Mucosa; Nasal Swab  Result Value Ref Range Status   MRSA by PCR Next Gen NOT DETECTED NOT DETECTED Final    Comment: (NOTE) The GeneXpert MRSA Assay (FDA approved for NASAL specimens only), is one component of a comprehensive MRSA colonization surveillance program. It is not intended to diagnose MRSA infection nor to guide or monitor treatment for MRSA infections. Test performance is not FDA  approved in patients less than 31 years old. Performed at [facility], [address], [place], [place] [address]   Aerobic/Anaerobic Culture w Gram Stain (surgical/deep wound)     Status: None (Preliminary result)   Collection Time: 11/29/21  9:35 AM   Specimen: PATH Soft tissue  Result Value Ref Range Status   Specimen Description   Final    TISSUE Performed at [facility], [address], [place], [place] [address]    Special Requests   Final    TISSUE Performed at [facility], [address] [place], [place] [address]    Gram Stain   Final    RARE WBC PRESENT,BOTH PMN AND MONONUCLEAR NO ORGANISMS SEEN    Culture   Final    FEW STAPHYLOCOCCUS AUREUS SUSCEPTIBILITIES TO FOLLOW CRITICAL RESULT CALLED TO, READ BACK BY AND VERIFIED WITH: MSG THROUGH EPIC TO MD [facility] Jaleen Finch AT 5364 ON 11/30/2021 BY T.SAAD. Performed at [facility], [address], [place], [place] [address]    Report Status PENDING  Incomplete  Aerobic/Anaerobic Culture w Gram Stain (surgical/deep wound)     Status: None (Preliminary result)   Collection Time: 11/29/21  9:38 AM   Specimen: PATH Soft tissue  Result Value Ref Range Status   Specimen Description   Final    TISSUE Performed at [facility], [address], [place], [place] [address]  Special Requests   Final    TISSUE Performed at [facility], [address], [place], [place] [address]    Gram Stain NO WBC SEEN NO ORGANISMS SEEN   Final   Culture   Final    FEW STAPHYLOCOCCUS AUREUS CULTURE REINCUBATED FOR BETTER GROWTH Performed at [facility], [address] [place], [place] [address]    Report Status PENDING  Incomplete     Labs: CBC: Recent Labs  Lab 11/27/21 0959 11/29/21 0328 11/30/21 0329  WBC 12.2* 6.2 8.1  NEUTROABS 10.4*  --  5.8  HGB 12.5* 9.6* 10.4*  HCT 37.4* 29.9* 32.0*  MCV 81.1 83.8 82.5  PLT 224  209 274   Basic Metabolic Panel: Recent Labs  Lab 11/27/21 0959 11/29/21 0328 11/30/21 0329  NA 132* 140 140  K 3.5 3.4* 3.7  CL 97* 105 106  CO2 25 27 25   GLUCOSE 132* 114* 113*  BUN 13 14 13   CREATININE 0.56* 0.62 0.53*  CALCIUM 9.0 8.8* 9.2  MG  --   --  2.0   Liver Function Tests: Recent Labs  Lab 11/27/21 0959 11/29/21 0328  AST 77* 32  ALT 52* 40  ALKPHOS 177* 157*  BILITOT 0.6 0.4  PROT 7.6 6.5  ALBUMIN 3.4* 2.7*   No results for input(s): "LIPASE", "AMYLASE" in the last 168 hours. No results for input(s): "AMMONIA" in the last 168 hours. Cardiac Enzymes: No results for input(s): "CKTOTAL", "CKMB", "CKMBINDEX", "TROPONINI" in the last 168 hours. BNP (last 3 results) No results for input(s): "BNP" in the last 8760 hours. CBG: No results for input(s): "GLUCAP" in the last 168 hours.   Signed:  11/29/21  [facility] 11/30/2021, 5:35 PM

## 2021-12-01 ENCOUNTER — Other Ambulatory Visit: Payer: Self-pay

## 2021-12-01 ENCOUNTER — Encounter (HOSPITAL_COMMUNITY): Payer: Self-pay | Admitting: Emergency Medicine

## 2021-12-01 ENCOUNTER — Emergency Department (HOSPITAL_COMMUNITY)
Admission: EM | Admit: 2021-12-01 | Discharge: 2021-12-01 | Disposition: A | Discharge status: Home / Self Care | Payer: 59 | Attending: Emergency Medicine | Admitting: Emergency Medicine

## 2021-12-01 DIAGNOSIS — R000 Tachycardia, unspecified: Secondary | ICD-10-CM | POA: Insufficient documentation

## 2021-12-01 DIAGNOSIS — Z4800 Encounter for change or removal of nonsurgical wound dressing: Secondary | ICD-10-CM | POA: Diagnosis not present

## 2021-12-01 DIAGNOSIS — R69 Illness, unspecified: Secondary | ICD-10-CM | POA: Diagnosis not present

## 2021-12-01 DIAGNOSIS — Z4801 Encounter for change or removal of surgical wound dressing: Secondary | ICD-10-CM | POA: Diagnosis not present

## 2021-12-01 DIAGNOSIS — Z5189 Encounter for other specified aftercare: Secondary | ICD-10-CM

## 2021-12-01 DIAGNOSIS — F17200 Nicotine dependence, unspecified, uncomplicated: Secondary | ICD-10-CM | POA: Insufficient documentation

## 2021-12-01 DIAGNOSIS — L02415 Cutaneous abscess of right lower limb: Secondary | ICD-10-CM | POA: Diagnosis not present

## 2021-12-01 LAB — CULTURE, BLOOD (ROUTINE X 2): Culture: NO GROWTH

## 2021-12-01 MED ORDER — DOXYCYCLINE HYCLATE 100 MG PO CAPS: 100.0000 mg | ORAL_CAPSULE | Freq: Two times a day (BID) | ORAL | 0 refills | Status: AC

## 2021-12-01 NOTE — ED Provider Notes (Signed)
[facility]-EMERGENCY DEPT Provider Note   CSN: [id number] Arrival date & time: 12/01/21  1040     History  Chief Complaint  Patient presents with   Wound Check    John Cabrera is a 51 y.o. male.  Patient is a 51 year old male with a prior history of heroin abuse, tobacco use, recent hospitalization for a abscess on his right knee with an infected bursitis who underwent surgery with Dr. Everardo Pacific with bursa removal, I&D and packing of 2 feet who had been on vancomycin and cefepime but left AMA yesterday because he reports his wife is having a really hard time because she has schizophrenia and he had to get home.  He is returning today because he reports he is not sure if he needs to be on any antibiotic.  He did pull out 1 to 2 inches of packing yesterday and cut it and redressed his wound.  He reports overall it feels better and feels like it looks better.  He denies any systemic symptoms but just wanted to make sure there was nothing else he needed to do.  The history is provided by the patient.  Wound Check       Home Medications Prior to Admission medications   Medication Sig Start Date End Date Taking? Authorizing Provider  doxycycline (VIBRAMYCIN) 100 MG capsule Take 1 capsule (100 mg total) by mouth 2 (two) times daily. 12/01/21  Yes Gwyneth Sprout, MD  acetaminophen (TYLENOL) 500 MG tablet Take 1,000 mg by mouth every 6 (six) hours as needed for mild pain.    [provider]  ibuprofen (ADVIL) 200 MG tablet Take 600 mg by mouth every 6 (six) hours as needed for mild pain.    [provider]      Allergies    Asa [aspirin]    Review of Systems   Review of Systems  Physical Exam Updated Vital Signs BP (!) 145/99 (BP Location: Right Arm)   Pulse (!) 103   Temp 98.3 F (36.8 C) (Oral)   Resp 18   SpO2 100%  Physical Exam Vitals and nursing note reviewed.  HENT:     Head: Normocephalic.  Cardiovascular:     Rate and Rhythm:  Tachycardia present.     Pulses: Normal pulses.  Pulmonary:     Effort: Pulmonary effort is normal.  Musculoskeletal:     Comments: Abscess present over the anterior knee with packing in place and 2 sutures.  Currently draining purulent discharge.  Surrounding cellulitis appears to be improving.  Patient is able to flex his knee greater than 90 degrees and swelling still noted.  Skin:    General: Skin is warm and dry.  Neurological:     Mental Status: He is alert. Mental status is at baseline.  Psychiatric:        Mood and Affect: Mood normal.     ED Results / Procedures / Treatments   Labs (all labs ordered are listed, but only abnormal results are displayed) Labs Reviewed - No data to display  EKG None  Radiology No results found.  Procedures Procedures    Medications Ordered in ED Medications - No data to display  ED Course/ Medical Decision Making/ A&P                           Medical Decision Making Risk Prescription drug management.   Patient here today just to have a wound check and confirm  that he does not need to be on any outpatient antibiotics.  He did leave AMA yesterday from the hospital and had antibiotics prior to leaving.  He did remove 2 inches of packing yesterday and redressed his wound.  He feels today it is improving.  I evaluated patient's external medical records from Dr. Everardo Pacific his orthopedist he reported that patient could be discharged per medicine service and patient needed to pull out the packing 1 to 2 inches daily until it was complete.  This was discussed with the patient and he seems comfortable doing this.  Cellulitis appears to be improving.  We will start patient on doxycycline for staph and strep coverage.  Wound was redressed.  He was given return precautions.        Final Clinical Impression(s) / ED Diagnoses Final diagnoses:  Visit for wound check    Rx / DC Orders ED Discharge Orders          Ordered    doxycycline  (VIBRAMYCIN) 100 MG capsule  2 times daily        12/01/21 1119              Gwyneth Sprout, MD 12/01/21 1203

## 2021-12-01 NOTE — ED Triage Notes (Signed)
Pt reports having right knee drained approx 1 week ago. Pt reports having no issues w/ the wound site, but states that he needs to know if he needs to be on antibiotics.

## 2021-12-01 NOTE — Discharge Instructions (Signed)
Start the antibiotics today.  Keep pulling 2in of packing out daily until it is all pulled out.  If you start having fever or it starts looking worse return to the ER.

## 2021-12-04 LAB — AEROBIC/ANAEROBIC CULTURE W GRAM STAIN (SURGICAL/DEEP WOUND): Gram Stain: NONE SEEN

## 2021-12-31 LAB — FUNGUS CULTURE WITH STAIN

## 2021-12-31 LAB — FUNGUS CULTURE RESULT

## 2021-12-31 LAB — FUNGAL ORGANISM REFLEX

## 2022-11-10 ENCOUNTER — Encounter: Payer: 59 | Admitting: Family

## 2022-11-10 NOTE — Progress Notes (Signed)
Erroneous encounter-disregard

## 2023-05-17 NOTE — Progress Notes (Deleted)
   New Patient Office Visit  Subjective   Patient ID: John Cabrera, male    DOB: Aug 17, 1970  Age: 53 y.o. MRN: [id number]  CC: No chief complaint on file.   HPI John Cabrera presents to establish care ***  Anemia - recheck cbc.   PMH: substance abuse,   PSH: ***  FH: ***  Tobacco use: *** Alcohol use: *** Drug use: *** Marital status: *** Employment: *** Sexual hx: ***  Screenings:  Colon Cancer: *** Lung Cancer: *** Breast Cancer: *** Diabetes: *** HLD: ***   Outpatient Encounter Medications as of 05/18/2023  Medication Sig   acetaminophen  (TYLENOL ) 500 MG tablet Take 1,000 mg by mouth every 6 (six) hours as needed for mild pain.   doxycycline  (VIBRAMYCIN ) 100 MG capsule Take 1 capsule (100 mg total) by mouth 2 (two) times daily.   ibuprofen  (ADVIL ) 200 MG tablet Take 600 mg by mouth every 6 (six) hours as needed for mild pain.   No facility-administered encounter medications on file as of 05/18/2023.    Past Medical History:  Diagnosis Date   Heroin use    Tobacco use     Past Surgical History:  Procedure Laterality Date   HERNIA REPAIR Left 1916   groin   I & D EXTREMITY Right 11/29/2021   Procedure: IRRIGATION AND DEBRIDEMENT EXTREMITY;  Surgeon: Micheline Ahr, MD;  Location: [facility];  Service: Orthopedics;  Laterality: Right;    No family history on file.  Social History   Socioeconomic History   Marital status: Married    Spouse name: Not on file   Number of children: Not on file   Years of education: Not on file   Highest education level: Not on file  Occupational History   Not on file  Tobacco Use   Smoking status: Every Day    Current packs/day: 1.00    Types: Cigarettes   Smokeless tobacco: Not on file  Substance and Sexual Activity   Alcohol use: Yes   Drug use: Yes    Types: IV   Sexual activity: Not on file  Other Topics Concern   Not on file  Social History Narrative   Not on file   Social Drivers of Health   Financial  Resource Strain: Not on file  Food Insecurity: Not on file  Transportation Needs: Not on file  Physical Activity: Not on file  Stress: Not on file  Social Connections: Not on file  Intimate Partner Violence: Not on file    ROS     Objective   There were no vitals taken for this visit.  Physical Exam     Assessment & Plan:   There are no diagnoses linked to this encounter.  No follow-ups on file.   Laneta Pintos, MD

## 2023-05-18 ENCOUNTER — Ambulatory Visit: Payer: Self-pay | Admitting: Family Medicine

## 2023-06-20 ENCOUNTER — Ambulatory Visit: Payer: Self-pay | Admitting: Family Medicine

## 2023-06-20 NOTE — Progress Notes (Deleted)
   New Patient Office Visit  Subjective   Patient ID: John Cabrera, male    DOB: 02-22-1971  Age: 53 y.o. MRN: [id number]  CC: No chief complaint on file.   HPI John Cabrera presents to establish care ***  PMH: Tobacco use, heroin use  PSH: Hernia repair  FH: ***  Tobacco use: *** Alcohol use: *** Drug use: *** Marital status: *** Employment: *** Sexual hx: ***  Screenings:  Colon Cancer: *** Lung Cancer: *** Breast Cancer: *** Diabetes: *** HLD: ***   Outpatient Encounter Medications as of 06/20/2023  Medication Sig   acetaminophen (TYLENOL) 500 MG tablet Take 1,000 mg by mouth every 6 (six) hours as needed for mild pain.   doxycycline (VIBRAMYCIN) 100 MG capsule Take 1 capsule (100 mg total) by mouth 2 (two) times daily.   ibuprofen (ADVIL) 200 MG tablet Take 600 mg by mouth every 6 (six) hours as needed for mild pain.   No facility-administered encounter medications on file as of 06/20/2023.    Past Medical History:  Diagnosis Date   Heroin use    Tobacco use     Past Surgical History:  Procedure Laterality Date   HERNIA REPAIR Left 1916   groin   I & D EXTREMITY Right 11/29/2021   Procedure: IRRIGATION AND DEBRIDEMENT EXTREMITY;  Surgeon: Bjorn Pippin, MD;  Location: [facility];  Service: Orthopedics;  Laterality: Right;    No family history on file.  Social History   Socioeconomic History   Marital status: Married    Spouse name: Not on file   Number of children: Not on file   Years of education: Not on file   Highest education level: Not on file  Occupational History   Not on file  Tobacco Use   Smoking status: Every Day    Current packs/day: 1.00    Types: Cigarettes   Smokeless tobacco: Not on file  Substance and Sexual Activity   Alcohol use: Yes   Drug use: Yes    Types: IV   Sexual activity: Not on file  Other Topics Concern   Not on file  Social History Narrative   Not on file   Social Drivers of Health   Financial Resource  Strain: Not on file  Food Insecurity: Not on file  Transportation Needs: Not on file  Physical Activity: Not on file  Stress: Not on file  Social Connections: Not on file  Intimate Partner Violence: Not on file    ROS     Objective   There were no vitals taken for this visit.  Physical Exam     Assessment & Plan:   There are no diagnoses linked to this encounter.  No follow-ups on file.   Sandre Kitty, MD

## 2023-08-30 ENCOUNTER — Ambulatory Visit: Payer: Self-pay | Admitting: Family Medicine

## 2024-03-22 ENCOUNTER — Ambulatory Visit: Payer: MEDICAID

## 2024-04-01 NOTE — Progress Notes (Incomplete)
   New Patient Pulmonology Office Visit   Subjective:  Patient ID: John Cabrera, male    DOB: 1970-05-26  MRN: [id number]  Referred by: Trudy Lesley LABOR, PA  CC: No chief complaint on file.   HPI John Cabrera is a 53 y.o. male with ***   {PULM QUESTIONNAIRES (Optional):33196}  ROS  Allergies: Asa [aspirin]  Current Outpatient Medications:    acetaminophen  (TYLENOL ) 500 MG tablet, Take 1,000 mg by mouth every 6 (six) hours as needed for mild pain., Disp: , Rfl:    doxycycline  (VIBRAMYCIN ) 100 MG capsule, Take 1 capsule (100 mg total) by mouth 2 (two) times daily., Disp: 20 capsule, Rfl: 0   ibuprofen  (ADVIL ) 200 MG tablet, Take 600 mg by mouth every 6 (six) hours as needed for mild pain., Disp: , Rfl:  Past Medical History:  Diagnosis Date   Heroin use    Tobacco use    Past Surgical History:  Procedure Laterality Date   HERNIA REPAIR Left 1916   groin   I & D EXTREMITY Right 11/29/2021   Procedure: IRRIGATION AND DEBRIDEMENT EXTREMITY;  Surgeon: Cristy Bonner DASEN, MD;  Location: [facility];  Service: Orthopedics;  Laterality: Right;   No family history on file. Social History   Socioeconomic History   Marital status: Married    Spouse name: Not on file   Number of children: Not on file   Years of education: Not on file   Highest education level: Not on file  Occupational History   Not on file  Tobacco Use   Smoking status: Every Day    Current packs/day: 1.00    Types: Cigarettes   Smokeless tobacco: Not on file  Substance and Sexual Activity   Alcohol use: Yes   Drug use: Yes    Types: IV   Sexual activity: Not on file  Other Topics Concern   Not on file  Social History Narrative   Not on file   Social Drivers of Health   Financial Resource Strain: Not on file  Food Insecurity: Not on file  Transportation Needs: Not on file  Physical Activity: Not on file  Stress: Not on file  Social Connections: Not on file  Intimate Partner Violence: Not on file        Objective:  There were no vitals taken for this visit. {Pulm Vitals (Optional):32837}  Physical Exam  Diagnostic Review:  {Labs (Optional):32838}     Assessment & Plan:   Assessment & Plan    No follow-ups on file.    Marny Patch, MD Pulmonary and Critical Care Medicine [facility]

## 2024-04-02 ENCOUNTER — Ambulatory Visit: Payer: MEDICAID

## 2024-04-24 ENCOUNTER — Encounter: Payer: Self-pay | Admitting: Gastroenterology

## 2024-04-30 ENCOUNTER — Encounter: Payer: Self-pay | Admitting: Gastroenterology

## 2024-04-30 ENCOUNTER — Encounter
Admission: RE | Disposition: A | Discharge status: Home / Self Care | Payer: Self-pay | Source: Home / Self Care | Attending: Gastroenterology

## 2024-04-30 ENCOUNTER — Ambulatory Visit
Admission: RE | Admit: 2024-04-30 | Discharge: 2024-04-30 | Disposition: A | Discharge status: Home / Self Care | Payer: MEDICAID | Attending: Gastroenterology | Admitting: Gastroenterology

## 2024-04-30 ENCOUNTER — Ambulatory Visit: Payer: MEDICAID | Admitting: Anesthesiology

## 2024-04-30 ENCOUNTER — Other Ambulatory Visit: Payer: Self-pay

## 2024-04-30 DIAGNOSIS — Z1211 Encounter for screening for malignant neoplasm of colon: Secondary | ICD-10-CM | POA: Diagnosis present

## 2024-04-30 DIAGNOSIS — R195 Other fecal abnormalities: Secondary | ICD-10-CM | POA: Insufficient documentation

## 2024-04-30 DIAGNOSIS — I10 Essential (primary) hypertension: Secondary | ICD-10-CM | POA: Diagnosis not present

## 2024-04-30 DIAGNOSIS — Z83719 Family history of colon polyps, unspecified: Secondary | ICD-10-CM | POA: Diagnosis not present

## 2024-04-30 DIAGNOSIS — R1310 Dysphagia, unspecified: Secondary | ICD-10-CM | POA: Insufficient documentation

## 2024-04-30 DIAGNOSIS — F17210 Nicotine dependence, cigarettes, uncomplicated: Secondary | ICD-10-CM | POA: Diagnosis not present

## 2024-04-30 DIAGNOSIS — Z800 Family history of malignant neoplasm of digestive organs: Secondary | ICD-10-CM | POA: Insufficient documentation

## 2024-04-30 HISTORY — DX: Other psychoactive substance abuse, uncomplicated: F19.10

## 2024-04-30 HISTORY — PX: ESOPHAGOGASTRODUODENOSCOPY: SHX5428

## 2024-04-30 HISTORY — DX: Essential (primary) hypertension: I10

## 2024-04-30 HISTORY — PX: COLONOSCOPY: SHX5424

## 2024-04-30 SURGERY — COLONOSCOPY
Anesthesia: General

## 2024-04-30 MED ORDER — PROPOFOL 10 MG/ML IV BOLUS
INTRAVENOUS | Status: DC | PRN
  Administered 2024-04-30: 100 mg via INTRAVENOUS

## 2024-04-30 MED ORDER — LIDOCAINE HCL (CARDIAC) PF 100 MG/5ML IV SOSY
PREFILLED_SYRINGE | INTRAVENOUS | Status: DC | PRN
  Administered 2024-04-30: 100 mg via INTRAVENOUS

## 2024-04-30 MED ORDER — SODIUM CHLORIDE 0.9 % IV SOLN: INTRAVENOUS | Status: DC

## 2024-04-30 MED ORDER — PROPOFOL 500 MG/50ML IV EMUL
INTRAVENOUS | Status: DC | PRN
  Administered 2024-04-30: 150 ug/kg/min via INTRAVENOUS

## 2024-04-30 NOTE — Interval H&P Note (Signed)
 History and Physical Interval Note: Preprocedure H&P from 04/30/2024  was reviewed and there was no interval change after seeing and examining the patient.  Written consent was obtained from the patient after discussion of risks, benefits, and alternatives. Patient has consented to proceed with Esophagogastroduodenoscopy and Colonoscopy with possible intervention   04/30/2024 11:31 AM  John Cabrera  has presented today for surgery, with the diagnosis of Positive colorectal cancer screening using Cologuard test (R19.5) Colon cancer screening (Z12.11) Dysphagia, unspecified type (R13.10).  The various methods of treatment have been discussed with the patient and family. After consideration of risks, benefits and other options for treatment, the patient has consented to  Procedures: COLONOSCOPY (N/A) EGD (ESOPHAGOGASTRODUODENOSCOPY) (N/A) as a surgical intervention.  The patient's history has been reviewed, patient examined, no change in status, stable for surgery.  I have reviewed the patient's chart and labs.  Questions were answered to the patient's satisfaction.     John Ozell Jungling

## 2024-04-30 NOTE — Transfer of Care (Signed)
 Immediate Anesthesia Transfer of Care Note  Patient: John Cabrera  Procedure(s) Performed: COLONOSCOPY EGD (ESOPHAGOGASTRODUODENOSCOPY)  Patient Location: PACU  Anesthesia Type:General  Level of Consciousness: awake  Airway & Oxygen Therapy: Patient Spontanous Breathing  Post-op Assessment: Report given to RN  Post vital signs: Reviewed and stable  Last Vitals:  Vitals Value Taken Time  BP 82/54 04/30/24 12:13  Temp 35.4 C 04/30/24 12:13  Pulse 58 04/30/24 12:14  Resp 10 04/30/24 12:14  SpO2 99 % 04/30/24 12:14  Vitals shown include unfiled device data.  Last Pain:  Vitals:   04/30/24 1213  TempSrc: Temporal  PainSc:          Complications: No notable events documented.

## 2024-04-30 NOTE — Op Note (Signed)
 [facility] Gastroenterology Patient Name: John Cabrera Procedure Date: 04/30/2024 11:34 AM MRN: [id number] Account #: [id number] Date of Birth: [birth year] Admit Type: Outpatient Age: 53 Room: [facility] ENDO ROOM 2 Gender: Male Note Status: Finalized Instrument Name: Upper GI Scope [id number] Procedure:             Upper GI endoscopy Indications:           Dysphagia Providers:             Elspeth Ozell Jungling DO, DO Referring MD:          No Local Md, MD (Referring MD) Medicines:             Monitored Anesthesia Care Complications:         No immediate complications. Estimated blood loss: None. Procedure:             Pre-Anesthesia Assessment:                        - Prior to the procedure, a History and Physical was                         performed, and patient medications and allergies were                         reviewed. The patient is competent. The risks and                         benefits of the procedure and the sedation options and                         risks were discussed with the patient. All questions                         were answered and informed consent was obtained.                         Patient identification and proposed procedure were                         verified by the physician, the nurse, the anesthetist                         and the technician in the endoscopy suite. Mental                         Status Examination: alert and oriented. Airway                         Examination: normal oropharyngeal airway and neck                         mobility. Respiratory Examination: clear to                         auscultation. CV Examination: RRR, no murmurs, no S3                         or S4. Prophylactic Antibiotics: The patient does not  require prophylactic antibiotics. Prior                         Anticoagulants: The patient has taken no anticoagulant                         or antiplatelet agents. ASA  Grade Assessment: II - A                         patient with mild systemic disease. After reviewing                         the risks and benefits, the patient was deemed in                         satisfactory condition to undergo the procedure. The                         anesthesia plan was to use monitored anesthesia care                         (MAC). Immediately prior to administration of                         medications, the patient was re-assessed for adequacy                         to receive sedatives. The heart rate, respiratory                         rate, oxygen saturations, blood pressure, adequacy of                         pulmonary ventilation, and response to care were                         monitored throughout the procedure. The physical                         status of the patient was re-assessed after the                         procedure.                        After obtaining informed consent, the endoscope was                         passed under direct vision. Throughout the procedure,                         the patient's blood pressure, pulse, and oxygen                         saturations were monitored continuously. The Endoscope                         was introduced through the mouth, and advanced to the  second part of duodenum. The upper GI endoscopy was                         accomplished without difficulty. The patient tolerated                         the procedure well. Findings:      The duodenal bulb, first portion of the duodenum, second portion of the       duodenum and third portion of the duodenum were normal. Estimated blood       loss: none.      The entire examined stomach was normal. Estimated blood loss: none.      The Z-line was regular.      Esophagogastric landmarks were identified: the gastroesophageal junction       was found at 36 cm from the incisors.      No endoscopic abnormality was evident in the  esophagus to explain the       patient's complaint of dysphagia. Estimated blood loss: none.      The exam of the esophagus was otherwise normal. Impression:            - Normal duodenal bulb, first portion of the duodenum,                         second portion of the duodenum and third portion of                         the duodenum.                        - Normal stomach.                        - Z-line regular.                        - Esophagogastric landmarks identified.                        - No endoscopic esophageal abnormality to explain                         patient's dysphagia.                        - No specimens collected. Recommendation:        - Patient has a contact number available for                         emergencies. The signs and symptoms of potential                         delayed complications were discussed with the patient.                         Return to normal activities tomorrow. Written                         discharge instructions were provided to the patient.                        -  Discharge patient to home.                        - Resume previous diet.                        - Continue present medications.                        - Return to GI clinic as previously scheduled.                        - The findings and recommendations were discussed with                         the patient. Procedure Code(s):     --- Professional ---                        [phone], Esophagogastroduodenoscopy, flexible,                         transoral; diagnostic, including collection of                         specimen(s) by brushing or washing, when performed                         (separate procedure) Diagnosis Code(s):     --- Professional ---                        R13.10, Dysphagia, unspecified CPT copyright 2022 American Medical Association. All rights reserved. The codes documented in this report are preliminary and upon coder review may  be revised  to meet current compliance requirements. Attending Participation:      I personally performed the entire procedure. Elspeth Jungling, DO Elspeth Ozell Jungling DO, DO 04/30/2024 11:46:09 AM This report has been signed electronically. Number of Addenda: 0 Note Initiated On: 04/30/2024 11:34 AM Estimated Blood Loss:  Estimated blood loss: none.      [facility]

## 2024-04-30 NOTE — H&P (Signed)
 "  Pre-Procedure H&P   Patient ID: John Cabrera is a 53 y.o. male.  Gastroenterology Provider: Elspeth Ozell Jungling, DO  Referring Provider: Lynda Rummer, PA PCP: [facility]   Date: 04/30/2024  HPI Mr. John Cabrera is a 53 y.o. male who presents today for Esophagogastroduodenoscopy and Colonoscopy for Positive Cologuard, dysphagia .  Patient with weight loss going from 220 pounds to 169 pounds.  He notes it has been hard to chew/swallow given his poor dentition  No previous upper endoscopy or colonoscopy  1 pack/day tobacco use  Family history colon cancer and colon polyps as below   Past Medical History:  Diagnosis Date   Heroin use    Hypertension    Substance abuse (HCC)    Tobacco use     Past Surgical History:  Procedure Laterality Date   HERNIA REPAIR Left 1916   groin   I & D EXTREMITY Right 11/29/2021   Procedure: IRRIGATION AND DEBRIDEMENT EXTREMITY;  Surgeon: Cristy Bonner DASEN, MD;  Location: [facility];  Service: Orthopedics;  Laterality: Right;    Family History MGM- CRC Brother and sister polyps No other h/o GI disease or malignancy  Review of Systems  Constitutional:  Negative for activity change, appetite change, chills, diaphoresis, fatigue, fever and unexpected weight change.  HENT:  Negative for trouble swallowing and voice change.   Respiratory:  Negative for shortness of breath and wheezing.   Cardiovascular:  Negative for chest pain, palpitations and leg swelling.  Gastrointestinal:  Negative for abdominal distention, abdominal pain, anal bleeding, blood in stool, constipation, diarrhea, nausea and vomiting.  Musculoskeletal:  Negative for arthralgias and myalgias.  Skin:  Negative for color change and pallor.  Neurological:  Negative for dizziness, syncope and weakness.  Psychiatric/Behavioral:  Negative for confusion. The patient is not nervous/anxious.   All other systems reviewed and are negative.     Medications Medications Ordered Prior to Encounter[1]  Pertinent medications related to GI and procedure were reviewed by me with the patient prior to the procedure  Current Medications[2]  sodium chloride  20 mL/hr at 04/30/24 1121       Allergies[3] Allergies were reviewed by me prior to the procedure  Objective   Body mass index is 23.32 kg/m. Vitals:   04/30/24 1037  BP: 110/67  Pulse: (!) 59  Resp: 18  Temp: (!) 97 F (36.1 C)  TempSrc: Temporal  SpO2: 100%  Weight: 75.8 kg  Height: 5' 11 (1.803 m)     Physical Exam Vitals and nursing note reviewed.  Constitutional:      General: He is not in acute distress.    Appearance: Normal appearance. He is not ill-appearing, toxic-appearing or diaphoretic.  HENT:     Head: Normocephalic and atraumatic.     Nose: Nose normal.     Mouth/Throat:     Mouth: Mucous membranes are moist.     Pharynx: Oropharynx is clear.     Comments: Poor dentition Eyes:     General: No scleral icterus.    Extraocular Movements: Extraocular movements intact.  Cardiovascular:     Rate and Rhythm: Regular rhythm. Bradycardia present.     Heart sounds: Normal heart sounds. No murmur heard.    No friction rub. No gallop.  Pulmonary:     Effort: Pulmonary effort is normal. No respiratory distress.     Breath sounds: No wheezing, rhonchi or rales.     Comments: Breath sounds Diminished bilaterally Abdominal:  General: Bowel sounds are normal. There is no distension.     Palpations: Abdomen is soft.     Tenderness: There is no abdominal tenderness. There is no guarding or rebound.  Musculoskeletal:     Cervical back: Neck supple.     Right lower leg: No edema.     Left lower leg: No edema.  Skin:    General: Skin is warm and dry.     Coloration: Skin is not jaundiced or pale.  Neurological:     General: No focal deficit present.     Mental Status: He is alert and oriented to person, place, and time. Mental status is at  baseline.  Psychiatric:        Mood and Affect: Mood normal.        Behavior: Behavior normal.        Thought Content: Thought content normal.        Judgment: Judgment normal.      Assessment:  Mr. John Cabrera is a 53 y.o. male  who presents today for Esophagogastroduodenoscopy and Colonoscopy for Positive Cologuard, dysphagia .  Plan:  Esophagogastroduodenoscopy and Colonoscopy with possible intervention today  Esophagogastroduodenoscopy and Colonoscopy with possible biopsy, control of bleeding, polypectomy, and interventions as necessary has been discussed with the patient/patient representative. Informed consent was obtained from the patient/patient representative after explaining the indication, nature, and risks of the procedure including but not limited to death, bleeding, perforation, missed neoplasm/lesions, cardiorespiratory compromise, and reaction to medications. Opportunity for questions was given and appropriate answers were provided. Patient/patient representative has verbalized understanding is amenable to undergoing the procedure.   Elspeth Ozell Jungling, DO  [facility] Gastroenterology  Portions of the record may have been created with voice recognition software. Occasional wrong-word or 'sound-a-like' substitutions may have occurred due to the inherent limitations of voice recognition software.  Read the chart carefully and recognize, using context, where substitutions may have occurred.     [1]  No current facility-administered medications on file prior to encounter.   Current Outpatient Medications on File Prior to Encounter  Medication Sig Dispense Refill   methadone (DOLOPHINE) 0.4 mg/mL SOLN Take 170 mg by mouth daily.     traZODone (DESYREL) 100 MG tablet Take 100 mg by mouth at bedtime.     acetaminophen  (TYLENOL ) 500 MG tablet Take 1,000 mg by mouth every 6 (six) hours as needed for mild pain.     doxycycline  (VIBRAMYCIN ) 100 MG capsule Take 1  capsule (100 mg total) by mouth 2 (two) times daily. 20 capsule 0   ibuprofen  (ADVIL ) 200 MG tablet Take 600 mg by mouth every 6 (six) hours as needed for mild pain.    [2]  Current Facility-Administered Medications:    0.9 %  sodium chloride  infusion, , Intravenous, Continuous, Yehia, Mcbain, DO, Last Rate: 20 mL/hr at 04/30/24 1121, New Bag at 04/30/24 1121 [3]  Allergies Allergen Reactions   Asa [Aspirin] Other (See Comments)    Causes stomach pain   "

## 2024-04-30 NOTE — Anesthesia Postprocedure Evaluation (Signed)
"   Anesthesia Post Note  Patient: Jaeceon Michelin  Procedure(s) Performed: COLONOSCOPY EGD (ESOPHAGOGASTRODUODENOSCOPY)  Patient location during evaluation: Endoscopy Anesthesia Type: General Level of consciousness: awake and alert Pain management: pain level controlled Vital Signs Assessment: post-procedure vital signs reviewed and stable Respiratory status: spontaneous breathing, nonlabored ventilation and respiratory function stable Cardiovascular status: blood pressure returned to baseline and stable Postop Assessment: no apparent nausea or vomiting Anesthetic complications: no   No notable events documented.   Last Vitals:  Vitals:   04/30/24 1223 04/30/24 1233  BP: 96/69 116/79  Pulse: (!) 56 (!) 58  Resp: 14 11  Temp:    SpO2: 100% 100%    Last Pain:  Vitals:   04/30/24 1233  TempSrc:   PainSc: 0-No pain                 Camellia Merilee Louder      "

## 2024-04-30 NOTE — Anesthesia Preprocedure Evaluation (Addendum)
"                                    Anesthesia Evaluation  Patient identified by MRN, date of birth, ID band Patient awake    Reviewed: Allergy & Precautions, H&P , NPO status , Patient's Chart, lab work & pertinent test results  Airway Mallampati: II  TM Distance: >3 FB Neck ROM: full    Dental  (+) Missing, Poor Dentition, Chipped   Pulmonary Current Smoker   Pulmonary exam normal        Cardiovascular hypertension, Normal cardiovascular exam     Neuro/Psych negative neurological ROS  negative psych ROS   GI/Hepatic negative GI ROS, Neg liver ROS,,,  Endo/Other  negative endocrine ROS    Renal/GU negative Renal ROS  negative genitourinary   Musculoskeletal   Abdominal Normal abdominal exam  (+)   Peds  Hematology negative hematology ROS (+)   Anesthesia Other Findings Past Medical History: No date: Heroin use No date: Tobacco use  Past Surgical History: 1916: HERNIA REPAIR; Left     Comment:  groin 11/29/2021: I & D EXTREMITY; Right     Comment:  Procedure: IRRIGATION AND DEBRIDEMENT EXTREMITY;                Surgeon: Cristy Bonner DASEN, MD;  Location: [facility];  Service:               Orthopedics;  Laterality: Right;     Reproductive/Obstetrics negative OB ROS                              Anesthesia Physical Anesthesia Plan  ASA: 2  Anesthesia Plan: General   Post-op Pain Management: Minimal or no pain anticipated   Induction: Intravenous  PONV Risk Score and Plan: Propofol  infusion and TIVA  Airway Management Planned: Natural Airway  Additional Equipment:   Intra-op Plan:   Post-operative Plan:   Informed Consent: I have reviewed the patients History and Physical, chart, labs and discussed the procedure including the risks, benefits and alternatives for the proposed anesthesia with the patient or authorized representative who has indicated his/her understanding and acceptance.     Dental Advisory  Given  Plan Discussed with: CRNA and Surgeon  Anesthesia Plan Comments:          Anesthesia Quick Evaluation  "

## 2024-04-30 NOTE — Op Note (Signed)
 [facility] Gastroenterology Patient Name: John Cabrera Procedure Date: 04/30/2024 11:34 AM MRN: [id number] Account #: [id number] Date of Birth: 11/06/1970 Admit Type: Outpatient Age: 53 Room: [facility] ENDO ROOM 2 Gender: Male Note Status: Finalized Instrument Name: Colon Scope [id number] Procedure:             Colonoscopy Indications:           Positive Cologuard test Providers:             Elspeth Ozell Jungling DO, DO Referring MD:          No Local Md, MD (Referring MD) Medicines:             Monitored Anesthesia Care Complications:         No immediate complications. Estimated blood loss: None. Procedure:             Pre-Anesthesia Assessment:                        - Prior to the procedure, a History and Physical was                         performed, and patient medications and allergies were                         reviewed. The patient is competent. The risks and                         benefits of the procedure and the sedation options and                         risks were discussed with the patient. All questions                         were answered and informed consent was obtained.                         Patient identification and proposed procedure were                         verified by the physician, the nurse, the anesthetist                         and the technician in the endoscopy suite. Mental                         Status Examination: alert and oriented. Airway                         Examination: normal oropharyngeal airway and neck                         mobility. Respiratory Examination: clear to                         auscultation. CV Examination: RRR, no murmurs, no S3                         or S4. Prophylactic Antibiotics: The patient does not  require prophylactic antibiotics. Prior                         Anticoagulants: The patient has taken no anticoagulant                         or antiplatelet agents. ASA  Grade Assessment: II - A                         patient with mild systemic disease. After reviewing                         the risks and benefits, the patient was deemed in                         satisfactory condition to undergo the procedure. The                         anesthesia plan was to use monitored anesthesia care                         (MAC). Immediately prior to administration of                         medications, the patient was re-assessed for adequacy                         to receive sedatives. The heart rate, respiratory                         rate, oxygen saturations, blood pressure, adequacy of                         pulmonary ventilation, and response to care were                         monitored throughout the procedure. The physical                         status of the patient was re-assessed after the                         procedure.                        After obtaining informed consent, the colonoscope was                         passed under direct vision. Throughout the procedure,                         the patient's blood pressure, pulse, and oxygen                         saturations were monitored continuously. The                         Colonoscope was introduced through the anus and  advanced to the the terminal ileum, with                         identification of the appendiceal orifice and IC                         valve. The colonoscopy was performed without                         difficulty. The patient tolerated the procedure well.                         The quality of the bowel preparation was evaluated                         using the BBPS [facility] Bowel Preparation Scale) with                         scores of: Right Colon = 3, Transverse Colon = 3 and                         Left Colon = 3 (entire mucosa seen well with no                         residual staining, small fragments of stool or opaque                          liquid). The total BBPS score equals 9. The terminal                         ileum, ileocecal valve, appendiceal orifice, and                         rectum were photographed. Findings:      The perianal and digital rectal examinations were normal. Pertinent       negatives include normal sphincter tone.      The terminal ileum appeared normal. Estimated blood loss: none.      Retroflexion in the right colon was performed.      The entire examined colon appeared normal on direct and retroflexion       views. Impression:            - The examined portion of the ileum was normal.                        - The entire examined colon is normal on direct and                         retroflexion views.                        - No specimens collected. Recommendation:        - Patient has a contact number available for                         emergencies. The signs and symptoms of potential  delayed complications were discussed with the patient.                         Return to normal activities tomorrow. Written                         discharge instructions were provided to the patient.                        - Discharge patient to home.                        - Resume previous diet.                        - Continue present medications.                        - Repeat colonoscopy in 5 years for screening purposes.                        - Return to GI office as previously scheduled.                        - Recommend CT of chest abdomen pelvis (if not already                         performed) given significant weight loss.                        - The findings and recommendations were discussed with                         the patient. Procedure Code(s):     --- Professional ---                        [phone], Colonoscopy, flexible; diagnostic, including                         collection of specimen(s) by brushing or washing, when                          performed (separate procedure) Diagnosis Code(s):     --- Professional ---                        R19.5, Other fecal abnormalities CPT copyright 2022 American Medical Association. All rights reserved. The codes documented in this report are preliminary and upon coder review may  be revised to meet current compliance requirements. Attending Participation:      I personally performed the entire procedure. Elspeth Jungling, DO Elspeth Ozell Jungling DO, DO 04/30/2024 12:16:20 PM This report has been signed electronically. Number of Addenda: 0 Note Initiated On: 04/30/2024 11:34 AM Scope Withdrawal Time: 0 hours 12 minutes 45 seconds  Total Procedure Duration: 0 hours 18 minutes 48 seconds  Estimated Blood Loss:  Estimated blood loss: none.      [facility]

## 2024-05-01 ENCOUNTER — Encounter: Payer: Self-pay | Admitting: Gastroenterology
